# Patient Record
Sex: Female | Born: 1985 | Race: White | Hispanic: No | Marital: Single
Health system: Southern US, Community
[De-identification: ages and names within clinical notes are randomized; demographics above are authoritative.]

---
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
## 2011-04-30 ENCOUNTER — Emergency Department: Payer: Self-pay | Admitting: Emergency Medicine

## 2011-05-05 ENCOUNTER — Emergency Department: Payer: Self-pay | Admitting: Emergency Medicine

## 2011-05-07 ENCOUNTER — Inpatient Hospital Stay: Payer: Self-pay | Admitting: Psychiatry

## 2011-05-27 ENCOUNTER — Ambulatory Visit: Payer: Self-pay | Admitting: Internal Medicine

## 2011-06-04 ENCOUNTER — Emergency Department: Payer: Self-pay | Admitting: Emergency Medicine

## 2011-07-28 ENCOUNTER — Inpatient Hospital Stay: Payer: Self-pay | Admitting: Psychiatry

## 2011-08-01 LAB — COMPREHENSIVE METABOLIC PANEL
Anion Gap: 12 (ref 7–16)
BUN: 33 mg/dL — ABNORMAL HIGH (ref 7–18)
Bilirubin,Total: 0.7 mg/dL (ref 0.2–1.0)
Calcium, Total: 9.2 mg/dL (ref 8.5–10.1)
Chloride: 111 mmol/L — ABNORMAL HIGH (ref 98–107)
Co2: 21 mmol/L (ref 21–32)
EGFR (African American): 51 — ABNORMAL LOW
EGFR (Non-African Amer.): 42 — ABNORMAL LOW
Glucose: 105 mg/dL — ABNORMAL HIGH (ref 65–99)
Osmolality: 294 (ref 275–301)
Potassium: 3.8 mmol/L (ref 3.5–5.1)
SGOT(AST): 49 U/L — ABNORMAL HIGH (ref 15–37)
SGPT (ALT): 35 U/L
Sodium: 144 mmol/L (ref 136–145)
Total Protein: 9.1 g/dL — ABNORMAL HIGH (ref 6.4–8.2)

## 2011-08-01 LAB — VALPROIC ACID LEVEL: Valproic Acid: 73 ug/mL

## 2011-08-09 ENCOUNTER — Emergency Department: Payer: Self-pay | Admitting: Internal Medicine

## 2011-08-14 ENCOUNTER — Inpatient Hospital Stay: Payer: Self-pay | Admitting: Psychiatry

## 2011-08-14 LAB — URINALYSIS, COMPLETE
Bilirubin,UR: NEGATIVE
Ph: 6 (ref 4.5–8.0)
Protein: 100
RBC,UR: 7 /HPF (ref 0–5)
Squamous Epithelial: 45
WBC UR: 26 /HPF (ref 0–5)

## 2011-08-14 LAB — COMPREHENSIVE METABOLIC PANEL
Albumin: 2.6 g/dL — ABNORMAL LOW (ref 3.4–5.0)
Alkaline Phosphatase: 177 U/L — ABNORMAL HIGH (ref 50–136)
Anion Gap: 15 (ref 7–16)
Calcium, Total: 7.8 mg/dL — ABNORMAL LOW (ref 8.5–10.1)
Chloride: 113 mmol/L — ABNORMAL HIGH (ref 98–107)
Co2: 17 mmol/L — ABNORMAL LOW (ref 21–32)
Creatinine: 1.5 mg/dL — ABNORMAL HIGH (ref 0.60–1.30)
EGFR (African American): 54 — ABNORMAL LOW
EGFR (Non-African Amer.): 45 — ABNORMAL LOW
Osmolality: 286 (ref 275–301)
Potassium: 3.2 mmol/L — ABNORMAL LOW (ref 3.5–5.1)
SGPT (ALT): 26 U/L
Sodium: 145 mmol/L (ref 136–145)
Total Protein: 6.2 g/dL — ABNORMAL LOW (ref 6.4–8.2)

## 2011-08-14 LAB — PREGNANCY, URINE: Pregnancy Test, Urine: NEGATIVE m[IU]/mL

## 2011-08-14 LAB — CBC
MCH: 39.2 pg — ABNORMAL HIGH (ref 26.0–34.0)
MCV: 115 fL — ABNORMAL HIGH (ref 80–100)
Platelet: 126 10*3/uL — ABNORMAL LOW (ref 150–440)
RDW: 15 % — ABNORMAL HIGH (ref 11.5–14.5)
WBC: 6 10*3/uL (ref 3.6–11.0)

## 2011-08-14 LAB — DRUG SCREEN, URINE
Barbiturates, Ur Screen: NEGATIVE (ref ?–200)
Benzodiazepine, Ur Scrn: NEGATIVE (ref ?–200)
Cannabinoid 50 Ng, Ur ~~LOC~~: NEGATIVE (ref ?–50)
Cocaine Metabolite,Ur ~~LOC~~: NEGATIVE (ref ?–300)
MDMA (Ecstasy)Ur Screen: POSITIVE (ref ?–500)
Methadone, Ur Screen: NEGATIVE (ref ?–300)
Opiate, Ur Screen: POSITIVE (ref ?–300)
Tricyclic, Ur Screen: NEGATIVE (ref ?–1000)

## 2011-08-14 LAB — AMMONIA: Ammonia, Plasma: 38 mcmol/L — ABNORMAL HIGH (ref 11–32)

## 2011-08-14 LAB — TSH: Thyroid Stimulating Horm: 1.62 u[IU]/mL

## 2011-08-15 LAB — URINALYSIS, COMPLETE
Blood: NEGATIVE
Glucose,UR: >=500 mg/dL (ref 0–75)
Nitrite: NEGATIVE
Protein: 30
RBC,UR: 1 /HPF (ref 0–5)
WBC UR: 3 /HPF (ref 0–5)

## 2011-08-15 LAB — BASIC METABOLIC PANEL
Anion Gap: 9 (ref 7–16)
BUN: 6 mg/dL — ABNORMAL LOW (ref 7–18)
Calcium, Total: 8.5 mg/dL (ref 8.5–10.1)
Chloride: 114 mmol/L — ABNORMAL HIGH (ref 98–107)
EGFR (African American): 52 — ABNORMAL LOW
EGFR (Non-African Amer.): 43 — ABNORMAL LOW
Glucose: 83 mg/dL (ref 65–99)

## 2011-08-15 LAB — LIPID PANEL: VLDL Cholesterol, Calc: 28 mg/dL (ref 5–40)

## 2011-08-16 LAB — COMPREHENSIVE METABOLIC PANEL
Albumin: 2.8 g/dL — ABNORMAL LOW (ref 3.4–5.0)
Anion Gap: 9 (ref 7–16)
BUN: 9 mg/dL (ref 7–18)
Bilirubin,Total: 0.5 mg/dL (ref 0.2–1.0)
Chloride: 115 mmol/L — ABNORMAL HIGH (ref 98–107)
Co2: 19 mmol/L — ABNORMAL LOW (ref 21–32)
Creatinine: 1.55 mg/dL — ABNORMAL HIGH (ref 0.60–1.30)
EGFR (African American): 52 — ABNORMAL LOW
Potassium: 3.8 mmol/L (ref 3.5–5.1)
SGOT(AST): 48 U/L — ABNORMAL HIGH (ref 15–37)
Total Protein: 6.8 g/dL (ref 6.4–8.2)

## 2011-08-16 LAB — FOLATE: Folic Acid: 17.5 ng/mL (ref 3.1–100.0)

## 2011-08-17 LAB — BASIC METABOLIC PANEL
Anion Gap: 8 (ref 7–16)
BUN: 10 mg/dL (ref 7–18)
Calcium, Total: 8.5 mg/dL (ref 8.5–10.1)
Chloride: 114 mmol/L — ABNORMAL HIGH (ref 98–107)
Co2: 21 mmol/L (ref 21–32)
Creatinine: 1.68 mg/dL — ABNORMAL HIGH (ref 0.60–1.30)
EGFR (Non-African Amer.): 39 — ABNORMAL LOW
Osmolality: 283 (ref 275–301)
Potassium: 3.7 mmol/L (ref 3.5–5.1)

## 2011-08-18 LAB — BASIC METABOLIC PANEL
Calcium, Total: 8.1 mg/dL — ABNORMAL LOW (ref 8.5–10.1)
Co2: 18 mmol/L — ABNORMAL LOW (ref 21–32)
Creatinine: 1.54 mg/dL — ABNORMAL HIGH (ref 0.60–1.30)
EGFR (Non-African Amer.): 43 — ABNORMAL LOW
Sodium: 146 mmol/L — ABNORMAL HIGH (ref 136–145)

## 2011-08-19 LAB — BASIC METABOLIC PANEL
Calcium, Total: 8.5 mg/dL (ref 8.5–10.1)
Chloride: 114 mmol/L — ABNORMAL HIGH (ref 98–107)
Co2: 20 mmol/L — ABNORMAL LOW (ref 21–32)
EGFR (Non-African Amer.): 42 — ABNORMAL LOW
Potassium: 3.7 mmol/L (ref 3.5–5.1)
Sodium: 143 mmol/L (ref 136–145)

## 2011-08-20 LAB — PROTEIN ELECTROPHORESIS(ARMC)

## 2011-08-21 LAB — BASIC METABOLIC PANEL
BUN: 12 mg/dL (ref 7–18)
Co2: 21 mmol/L (ref 21–32)
Creatinine: 1.39 mg/dL — ABNORMAL HIGH (ref 0.60–1.30)
EGFR (Non-African Amer.): 49 — ABNORMAL LOW
Glucose: 79 mg/dL (ref 65–99)
Potassium: 4.1 mmol/L (ref 3.5–5.1)
Sodium: 144 mmol/L (ref 136–145)

## 2011-08-29 ENCOUNTER — Emergency Department: Payer: Self-pay | Admitting: Emergency Medicine

## 2011-08-30 LAB — URINALYSIS, COMPLETE
Glucose,UR: >=500 mg/dL (ref 0–75)
Nitrite: NEGATIVE
Ph: 7 (ref 4.5–8.0)
Protein: 30
RBC,UR: 90 /HPF (ref 0–5)
Specific Gravity: 1.002 (ref 1.003–1.030)

## 2011-08-30 LAB — COMPREHENSIVE METABOLIC PANEL
Anion Gap: 9 (ref 7–16)
BUN: 9 mg/dL (ref 7–18)
Chloride: 111 mmol/L — ABNORMAL HIGH (ref 98–107)
Creatinine: 1.22 mg/dL (ref 0.60–1.30)
EGFR (African American): >60
Osmolality: 276 (ref 275–301)
SGPT (ALT): 18 U/L
Sodium: 138 mmol/L (ref 136–145)
Total Protein: 7.3 g/dL (ref 6.4–8.2)

## 2011-08-30 LAB — DRUG SCREEN, URINE
Barbiturates, Ur Screen: NEGATIVE (ref ?–200)
Benzodiazepine, Ur Scrn: NEGATIVE (ref ?–200)
Cocaine Metabolite,Ur ~~LOC~~: NEGATIVE (ref ?–300)
Methadone, Ur Screen: NEGATIVE (ref ?–300)
Phencyclidine (PCP) Ur S: NEGATIVE (ref ?–25)
Tricyclic, Ur Screen: NEGATIVE (ref ?–1000)

## 2011-08-30 LAB — CBC
HCT: 39.6 % (ref 35.0–47.0)
HGB: 13.4 g/dL (ref 12.0–16.0)
MCV: 115 fL — ABNORMAL HIGH (ref 80–100)
WBC: 5.7 10*3/uL (ref 3.6–11.0)

## 2011-08-30 LAB — TSH: Thyroid Stimulating Horm: 1.16 u[IU]/mL

## 2011-08-30 LAB — PREGNANCY, URINE: Pregnancy Test, Urine: NEGATIVE m[IU]/mL

## 2011-08-30 LAB — SALICYLATE LEVEL: Salicylates, Serum: <1.7 mg/dL

## 2011-09-01 ENCOUNTER — Emergency Department: Payer: Self-pay | Admitting: Emergency Medicine

## 2011-09-07 ENCOUNTER — Emergency Department: Payer: Self-pay | Admitting: Emergency Medicine

## 2011-09-07 LAB — URINALYSIS, COMPLETE
Bilirubin,UR: NEGATIVE
Leukocyte Esterase: NEGATIVE
Ph: 7 (ref 4.5–8.0)
Protein: 100
RBC,UR: NONE SEEN /HPF (ref 0–5)
Specific Gravity: 1.024 (ref 1.003–1.030)
WBC UR: 7 /HPF (ref 0–5)

## 2011-09-07 LAB — DRUG SCREEN, URINE
Amphetamines, Ur Screen: NEGATIVE (ref ?–1000)
Cannabinoid 50 Ng, Ur ~~LOC~~: NEGATIVE (ref ?–50)
Cocaine Metabolite,Ur ~~LOC~~: NEGATIVE (ref ?–300)
MDMA (Ecstasy)Ur Screen: POSITIVE (ref ?–500)
Opiate, Ur Screen: NEGATIVE (ref ?–300)
Tricyclic, Ur Screen: NEGATIVE (ref ?–1000)

## 2011-09-13 ENCOUNTER — Emergency Department: Payer: Self-pay | Admitting: Emergency Medicine

## 2011-09-13 LAB — BASIC METABOLIC PANEL
Chloride: 107 mmol/L (ref 98–107)
Co2: 22 mmol/L (ref 21–32)
Creatinine: 1.34 mg/dL — ABNORMAL HIGH (ref 0.60–1.30)
EGFR (African American): >60
Potassium: 3.4 mmol/L — ABNORMAL LOW (ref 3.5–5.1)
Sodium: 139 mmol/L (ref 136–145)

## 2011-09-13 LAB — URINALYSIS, COMPLETE
Bilirubin,UR: NEGATIVE
Blood: NEGATIVE
Glucose,UR: >=500 mg/dL (ref 0–75)
Leukocyte Esterase: NEGATIVE
RBC,UR: 1 /HPF (ref 0–5)
Specific Gravity: 1.01 (ref 1.003–1.030)
Squamous Epithelial: 24

## 2011-09-13 LAB — CBC
HGB: 14.3 g/dL (ref 12.0–16.0)
RBC: 3.66 10*6/uL — ABNORMAL LOW (ref 3.80–5.20)
WBC: 5.6 10*3/uL (ref 3.6–11.0)

## 2011-09-13 LAB — ETHANOL: Ethanol %: <0.003 % (ref 0.000–0.080)

## 2011-09-13 LAB — DRUG SCREEN, URINE
Barbiturates, Ur Screen: NEGATIVE (ref ?–200)
Cannabinoid 50 Ng, Ur ~~LOC~~: NEGATIVE (ref ?–50)
Cocaine Metabolite,Ur ~~LOC~~: NEGATIVE (ref ?–300)
MDMA (Ecstasy)Ur Screen: POSITIVE (ref ?–500)
Methadone, Ur Screen: NEGATIVE (ref ?–300)
Opiate, Ur Screen: NEGATIVE (ref ?–300)
Phencyclidine (PCP) Ur S: NEGATIVE (ref ?–25)
Tricyclic, Ur Screen: NEGATIVE (ref ?–1000)

## 2011-09-13 LAB — TSH: Thyroid Stimulating Horm: 0.565 u[IU]/mL

## 2011-09-13 LAB — CK: CK, Total: 53 U/L (ref 21–215)

## 2011-09-15 ENCOUNTER — Emergency Department: Payer: Self-pay | Admitting: Emergency Medicine

## 2011-09-19 ENCOUNTER — Emergency Department: Payer: Self-pay | Admitting: Emergency Medicine

## 2011-09-26 ENCOUNTER — Emergency Department: Payer: Self-pay | Admitting: Emergency Medicine

## 2011-09-26 LAB — ACETAMINOPHEN LEVEL: Acetaminophen: <2 ug/mL

## 2011-09-26 LAB — URINALYSIS, COMPLETE
Bilirubin,UR: NEGATIVE
Glucose,UR: >=500 mg/dL (ref 0–75)
Ketone: NEGATIVE
Ph: 6 (ref 4.5–8.0)
RBC,UR: 10 /HPF (ref 0–5)
Squamous Epithelial: 33

## 2011-09-26 LAB — DRUG SCREEN, URINE
Barbiturates, Ur Screen: NEGATIVE (ref ?–200)
Benzodiazepine, Ur Scrn: NEGATIVE (ref ?–200)
Cannabinoid 50 Ng, Ur ~~LOC~~: NEGATIVE (ref ?–50)
Cocaine Metabolite,Ur ~~LOC~~: NEGATIVE (ref ?–300)
MDMA (Ecstasy)Ur Screen: POSITIVE (ref ?–500)
Methadone, Ur Screen: NEGATIVE (ref ?–300)
Phencyclidine (PCP) Ur S: NEGATIVE (ref ?–25)

## 2011-09-26 LAB — CBC
HCT: 39.3 % (ref 35.0–47.0)
HGB: 13.3 g/dL (ref 12.0–16.0)
MCH: 38.5 pg — ABNORMAL HIGH (ref 26.0–34.0)
MCHC: 33.8 g/dL (ref 32.0–36.0)
MCV: 114 fL — ABNORMAL HIGH (ref 80–100)
Platelet: 66 10*3/uL — ABNORMAL LOW (ref 150–440)
RBC: 3.45 10*6/uL — ABNORMAL LOW (ref 3.80–5.20)
RDW: 13.1 % (ref 11.5–14.5)
WBC: 6.2 10*3/uL (ref 3.6–11.0)

## 2011-09-26 LAB — COMPREHENSIVE METABOLIC PANEL
Albumin: 2.8 g/dL — ABNORMAL LOW (ref 3.4–5.0)
Alkaline Phosphatase: 170 U/L — ABNORMAL HIGH (ref 50–136)
Anion Gap: 13 (ref 7–16)
BUN: 18 mg/dL (ref 7–18)
Bilirubin,Total: 0.2 mg/dL (ref 0.2–1.0)
Calcium, Total: 8.3 mg/dL — ABNORMAL LOW (ref 8.5–10.1)
Chloride: 111 mmol/L — ABNORMAL HIGH (ref 98–107)
Co2: 18 mmol/L — ABNORMAL LOW (ref 21–32)
Creatinine: 1.25 mg/dL (ref 0.60–1.30)
EGFR (African American): >60
EGFR (Non-African Amer.): 55 — ABNORMAL LOW
Glucose: 96 mg/dL (ref 65–99)
Osmolality: 285 (ref 275–301)
Potassium: 4 mmol/L (ref 3.5–5.1)
SGOT(AST): 18 U/L (ref 15–37)
SGPT (ALT): 11 U/L — ABNORMAL LOW
Sodium: 142 mmol/L (ref 136–145)
Total Protein: 7.3 g/dL (ref 6.4–8.2)

## 2011-09-26 LAB — TSH: Thyroid Stimulating Horm: 1.85 u[IU]/mL

## 2011-09-26 LAB — SALICYLATE LEVEL: Salicylates, Serum: <1.7 mg/dL

## 2011-09-27 LAB — CBC
HCT: 40.9 % (ref 35.0–47.0)
HGB: 14 g/dL (ref 12.0–16.0)
MCHC: 34.2 g/dL (ref 32.0–36.0)
Platelet: 157 10*3/uL (ref 150–440)
WBC: 6.8 10*3/uL (ref 3.6–11.0)

## 2011-09-27 LAB — BASIC METABOLIC PANEL
BUN: 19 mg/dL — ABNORMAL HIGH (ref 7–18)
Chloride: 110 mmol/L — ABNORMAL HIGH (ref 98–107)
Creatinine: 1.4 mg/dL — ABNORMAL HIGH (ref 0.60–1.30)
EGFR (Non-African Amer.): 48 — ABNORMAL LOW
Glucose: 82 mg/dL (ref 65–99)
Osmolality: 284 (ref 275–301)
Potassium: 4 mmol/L (ref 3.5–5.1)

## 2011-10-23 ENCOUNTER — Ambulatory Visit: Payer: Self-pay | Admitting: Specialist

## 2011-10-28 ENCOUNTER — Emergency Department: Payer: Self-pay | Admitting: Emergency Medicine

## 2011-10-28 LAB — COMPREHENSIVE METABOLIC PANEL
Alkaline Phosphatase: 233 U/L — ABNORMAL HIGH (ref 50–136)
Anion Gap: 6 — ABNORMAL LOW (ref 7–16)
BUN: 15 mg/dL (ref 7–18)
Bilirubin,Total: 0.4 mg/dL (ref 0.2–1.0)
Calcium, Total: 8.3 mg/dL — ABNORMAL LOW (ref 8.5–10.1)
Chloride: 108 mmol/L — ABNORMAL HIGH (ref 98–107)
Co2: 23 mmol/L (ref 21–32)
Creatinine: 1.48 mg/dL — ABNORMAL HIGH (ref 0.60–1.30)
Glucose: 86 mg/dL (ref 65–99)
Potassium: 3.9 mmol/L (ref 3.5–5.1)
SGOT(AST): 15 U/L (ref 15–37)
SGPT (ALT): 15 U/L
Sodium: 137 mmol/L (ref 136–145)
Total Protein: 7.8 g/dL (ref 6.4–8.2)

## 2011-10-28 LAB — CBC
MCH: 38.9 pg — ABNORMAL HIGH (ref 26.0–34.0)
Platelet: 108 10*3/uL — ABNORMAL LOW (ref 150–440)
RBC: 3.74 10*6/uL — ABNORMAL LOW (ref 3.80–5.20)
WBC: 6.2 10*3/uL (ref 3.6–11.0)

## 2011-10-28 LAB — URINALYSIS, COMPLETE
Nitrite: NEGATIVE
Ph: 6 (ref 4.5–8.0)
Protein: 100
RBC,UR: 2 /HPF (ref 0–5)
Squamous Epithelial: 13

## 2011-10-28 LAB — PREGNANCY, URINE: Pregnancy Test, Urine: NEGATIVE m[IU]/mL

## 2011-10-30 ENCOUNTER — Emergency Department: Payer: Self-pay | Admitting: Emergency Medicine

## 2011-10-31 LAB — COMPREHENSIVE METABOLIC PANEL
Albumin: 2.9 g/dL — ABNORMAL LOW (ref 3.4–5.0)
Alkaline Phosphatase: 212 U/L — ABNORMAL HIGH (ref 50–136)
Anion Gap: 10 (ref 7–16)
BUN: 17 mg/dL (ref 7–18)
Calcium, Total: 8.2 mg/dL — ABNORMAL LOW (ref 8.5–10.1)
Chloride: 112 mmol/L — ABNORMAL HIGH (ref 98–107)
Co2: 19 mmol/L — ABNORMAL LOW (ref 21–32)
EGFR (African American): 60 — ABNORMAL LOW
EGFR (Non-African Amer.): 49 — ABNORMAL LOW
Potassium: 4 mmol/L (ref 3.5–5.1)
SGOT(AST): 25 U/L (ref 15–37)
SGPT (ALT): 15 U/L
Total Protein: 6.8 g/dL (ref 6.4–8.2)

## 2011-10-31 LAB — URINALYSIS, COMPLETE
Bilirubin,UR: NEGATIVE
Glucose,UR: 150 mg/dL (ref 0–75)
Hyaline Cast: 2
Ketone: NEGATIVE
Ph: 6 (ref 4.5–8.0)
RBC,UR: 18 /HPF (ref 0–5)
Specific Gravity: 1.021 (ref 1.003–1.030)
Squamous Epithelial: 29
Transitional Epi: 1
WBC UR: 106 /HPF (ref 0–5)

## 2011-10-31 LAB — CBC WITH DIFFERENTIAL/PLATELET
Basophil #: 0.1 10*3/uL (ref 0.0–0.1)
Basophil %: 2.6 %
Eosinophil #: 0.1 10*3/uL (ref 0.0–0.7)
HCT: 40.1 % (ref 35.0–47.0)
Lymphocyte %: 56.2 %
MCHC: 33.1 g/dL (ref 32.0–36.0)
Monocyte %: 8.9 %
Neutrophil #: 1.6 10*3/uL (ref 1.4–6.5)
RBC: 3.51 10*6/uL — ABNORMAL LOW (ref 3.80–5.20)

## 2011-10-31 LAB — PREGNANCY, URINE: Pregnancy Test, Urine: NEGATIVE m[IU]/mL

## 2011-11-01 ENCOUNTER — Emergency Department: Payer: Self-pay | Admitting: Emergency Medicine

## 2011-11-01 LAB — COMPREHENSIVE METABOLIC PANEL
Anion Gap: 10 (ref 7–16)
BUN: 19 mg/dL — ABNORMAL HIGH (ref 7–18)
Bilirubin,Total: 0.3 mg/dL (ref 0.2–1.0)
Calcium, Total: 8.4 mg/dL — ABNORMAL LOW (ref 8.5–10.1)
Chloride: 110 mmol/L — ABNORMAL HIGH (ref 98–107)
Co2: 19 mmol/L — ABNORMAL LOW (ref 21–32)
Creatinine: 1.55 mg/dL — ABNORMAL HIGH (ref 0.60–1.30)
EGFR (African American): 52 — ABNORMAL LOW
Glucose: 80 mg/dL (ref 65–99)
Potassium: 4.1 mmol/L (ref 3.5–5.1)
SGOT(AST): 31 U/L (ref 15–37)
SGPT (ALT): 19 U/L
Sodium: 139 mmol/L (ref 136–145)

## 2011-11-01 LAB — SALICYLATE LEVEL: Salicylates, Serum: <1.7 mg/dL

## 2011-11-01 LAB — CBC
HCT: 42.4 % (ref 35.0–47.0)
Platelet: 129 10*3/uL — ABNORMAL LOW (ref 150–440)
RBC: 3.7 10*6/uL — ABNORMAL LOW (ref 3.80–5.20)
WBC: 6.8 10*3/uL (ref 3.6–11.0)

## 2011-11-01 LAB — TSH: Thyroid Stimulating Horm: 2.43 u[IU]/mL

## 2011-11-01 LAB — ETHANOL: Ethanol %: <0.003 % (ref 0.000–0.080)

## 2011-11-02 LAB — URINALYSIS, COMPLETE
Glucose,UR: 150 mg/dL (ref 0–75)
Ketone: NEGATIVE
Nitrite: NEGATIVE
Ph: 7 (ref 4.5–8.0)
Protein: 30
RBC,UR: 104 /HPF (ref 0–5)
Specific Gravity: 1.012 (ref 1.003–1.030)
Squamous Epithelial: 5
WBC UR: 7 /HPF (ref 0–5)

## 2011-11-02 LAB — DRUG SCREEN, URINE
Amphetamines, Ur Screen: NEGATIVE (ref ?–1000)
Benzodiazepine, Ur Scrn: NEGATIVE (ref ?–200)
Cannabinoid 50 Ng, Ur ~~LOC~~: NEGATIVE (ref ?–50)
Cocaine Metabolite,Ur ~~LOC~~: NEGATIVE (ref ?–300)
Methadone, Ur Screen: NEGATIVE (ref ?–300)
Phencyclidine (PCP) Ur S: NEGATIVE (ref ?–25)

## 2011-11-02 LAB — PREGNANCY, URINE: Pregnancy Test, Urine: NEGATIVE m[IU]/mL

## 2011-11-03 LAB — URINE CULTURE

## 2013-12-02 IMAGING — CT CT ABD-PELV W/O CM
1 of 2 series · 16 of 32 positions shown, 20 images · non-contrast
Comparison: Abdominal Ultrasound of 05/01/2011.

REASON FOR EXAM: (1) abd pain; (2) abd pain;    NOTE: Nursing to Give
Oral CT Contrast
COMMENTS:

PROCEDURE:     CT  - CT ABDOMEN AND PELVIS W[DATE] [DATE]
RESULT:
HISTORY: Pain.

[Series 2: soft tissue · axial · 0.71mm/px · z∈[-442,-64]mm · 16 of 138 slices shown, 20 images]
[im 6/138  soft-tissue]
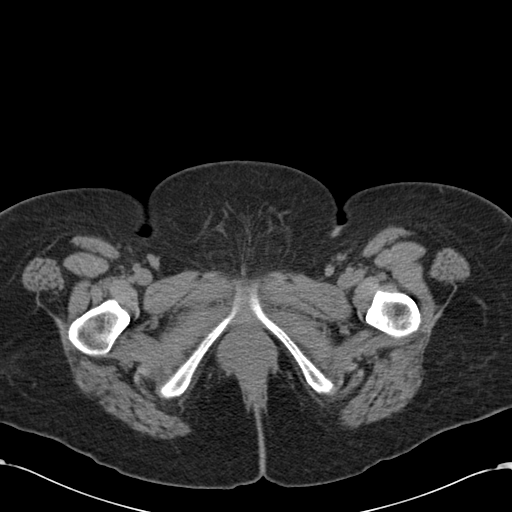
[im 6/138  bone]
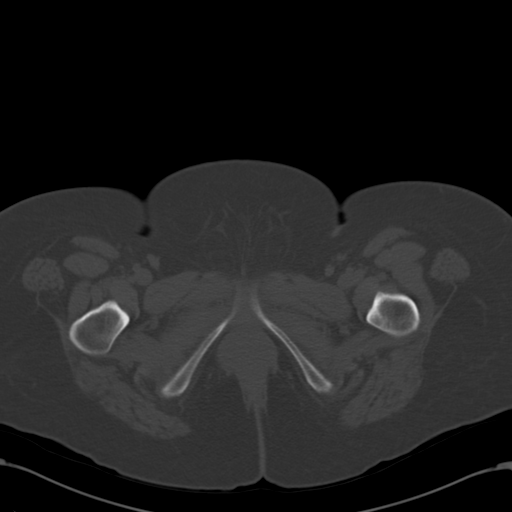
[im 16/138  soft-tissue]
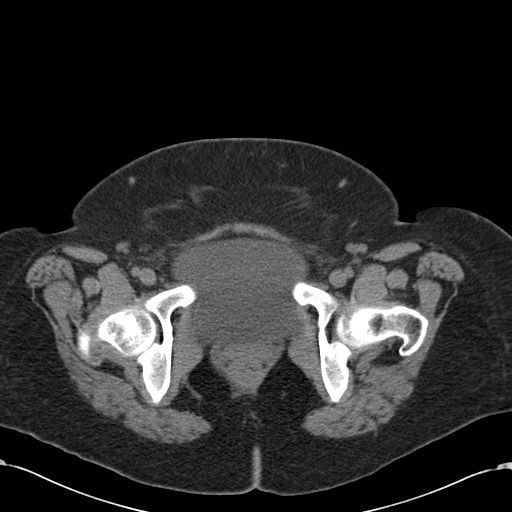
[im 27/138  soft-tissue]
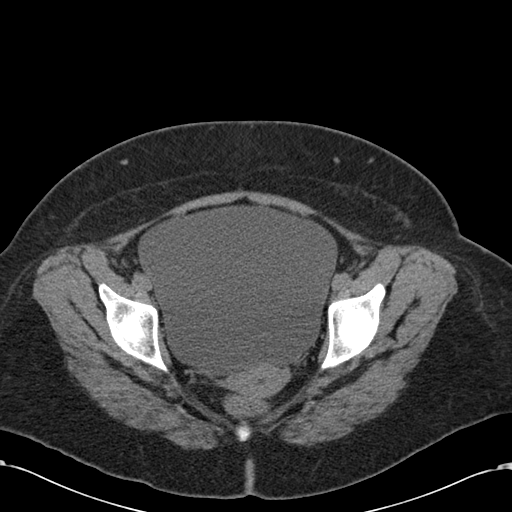
[im 37/138  soft-tissue]
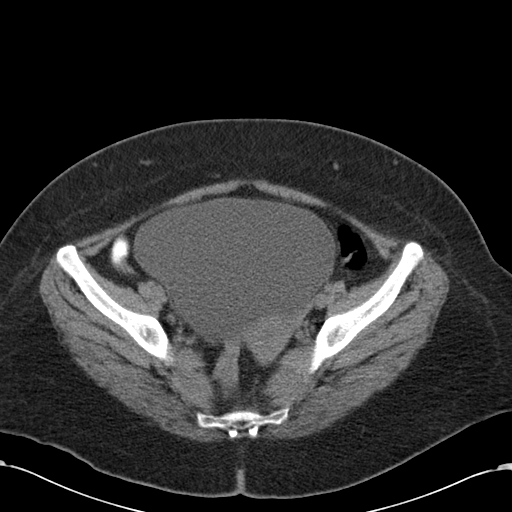
[im 48/138  soft-tissue]
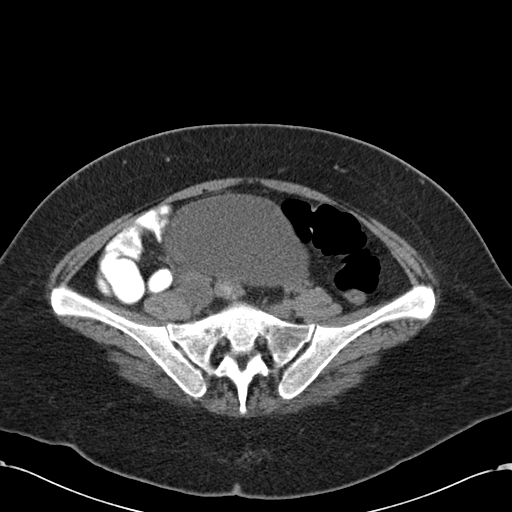
[im 53/138  soft-tissue]
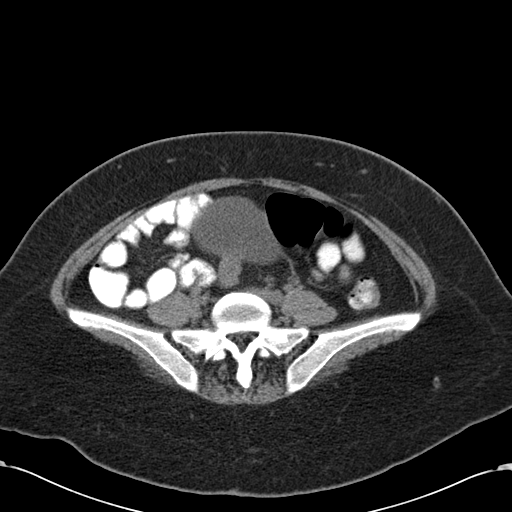
[im 64/138  soft-tissue]
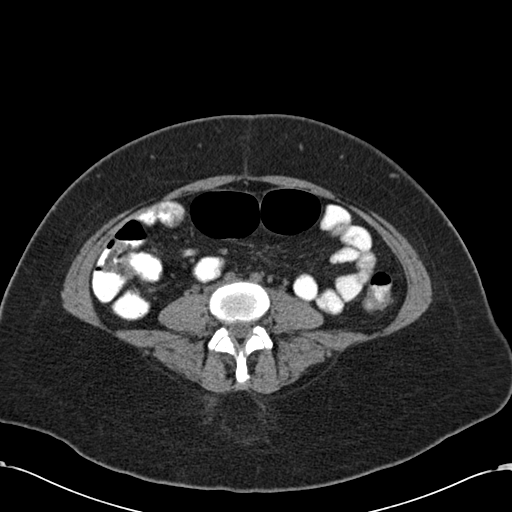
[im 74/138  soft-tissue]
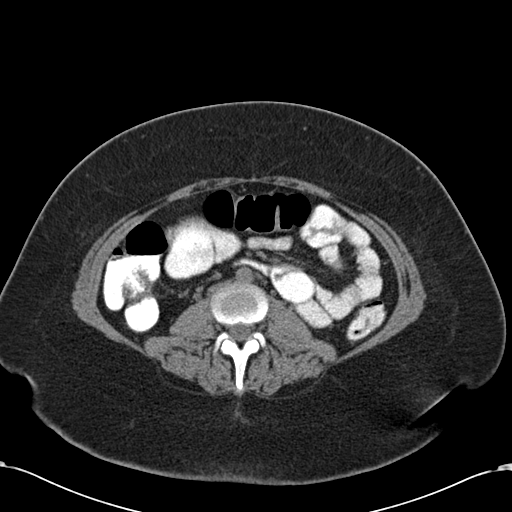
[im 85/138  soft-tissue]
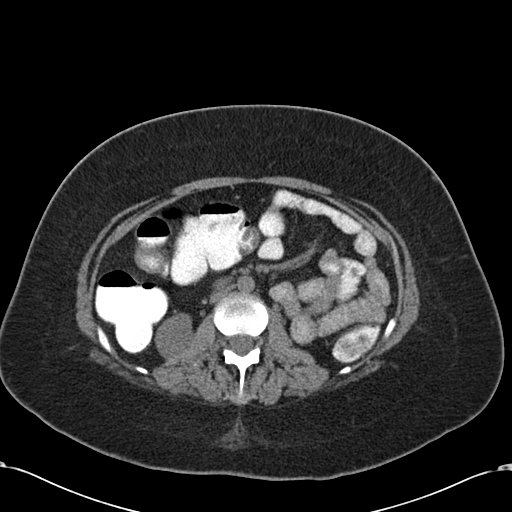
[im 85/138  bone]
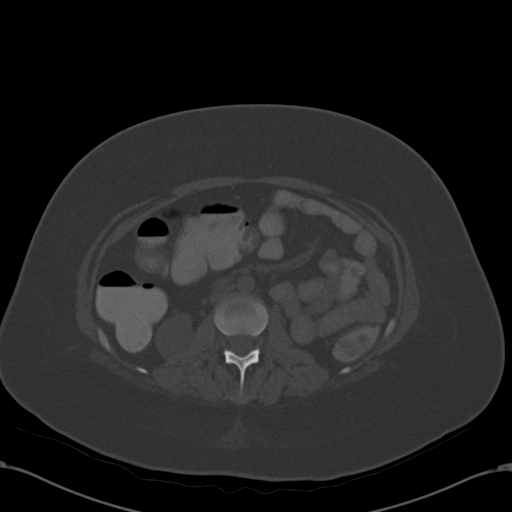
[im 90/138  soft-tissue]
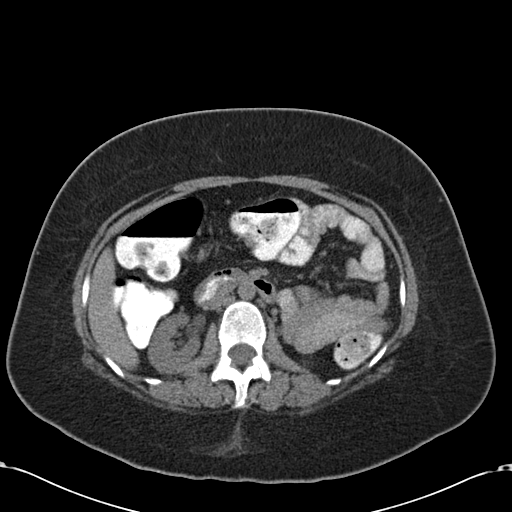
[im 101/138  soft-tissue]
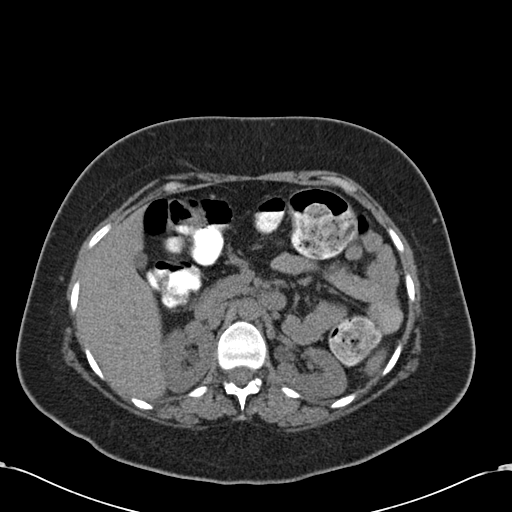
[im 111/138  soft-tissue]
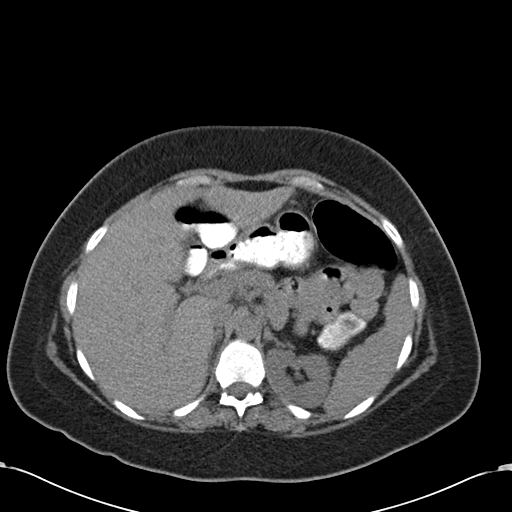
[im 116/138  lung]
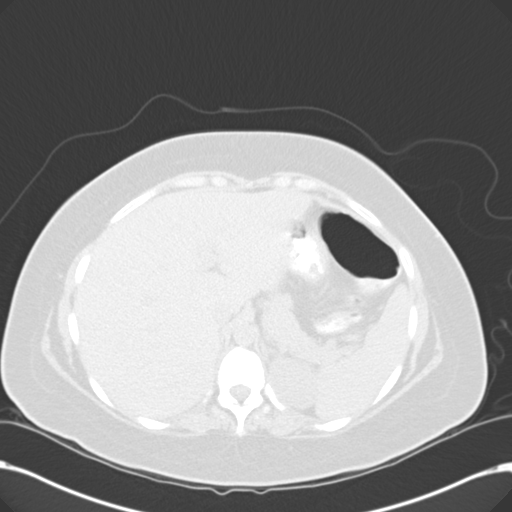
[im 122/138  soft-tissue]
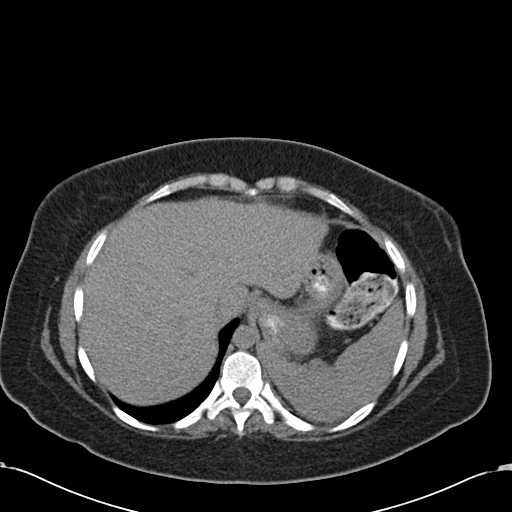
[im 122/138  lung]
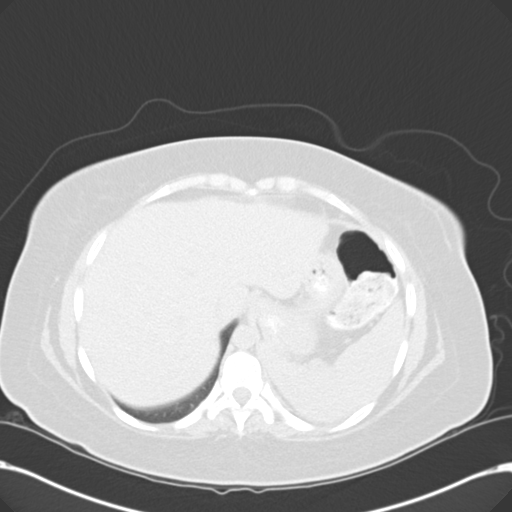
[im 127/138  lung]
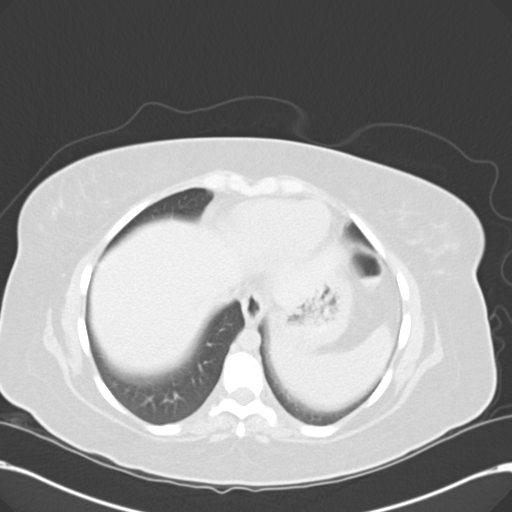
[im 132/138  soft-tissue]
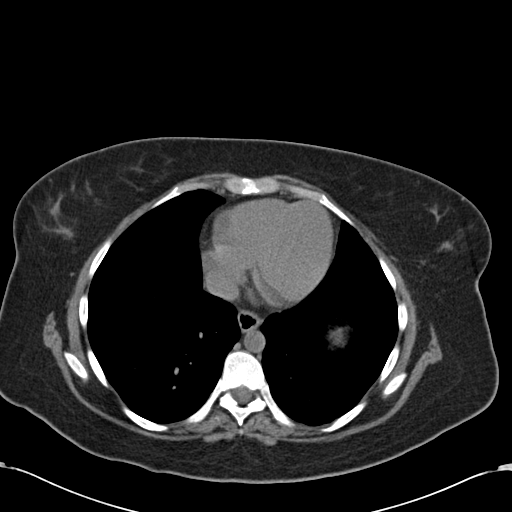
[im 132/138  lung]
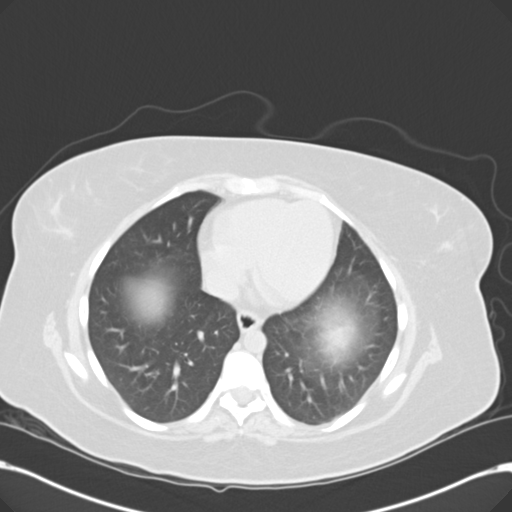

[16 of 32 positions shown; findings below may reference images not displayed]

PROCEDURE AND FINDINGS:  Standard nonenhanced scan obtained. The liver,
spleen, pancreas and adrenals are normal. There is no hydronephrosis. Mild
prominence of the bladder noted. No bladder wall thickening. Aorta is
nondistended. No bowel distention. Appendix is normal. The lung bases are
clear. No free air. Tiny, umbilical hernia with herniation of fat only noted.
IMPRESSION: Minimal bladder prominence. Otherwise unremarkable exam.

## 2013-12-06 IMAGING — RF DG UGI W/ KUB
9 series · 14 of 14 positions shown · non-contrast
Comparison: none

REASON FOR EXAM: presistent nausea
COMMENTS:

[Series 1: fluoro_barium 2fps_bw · 0.17mm/px · 1 of 1 slices shown (1 of 9)]
[im 1/1]
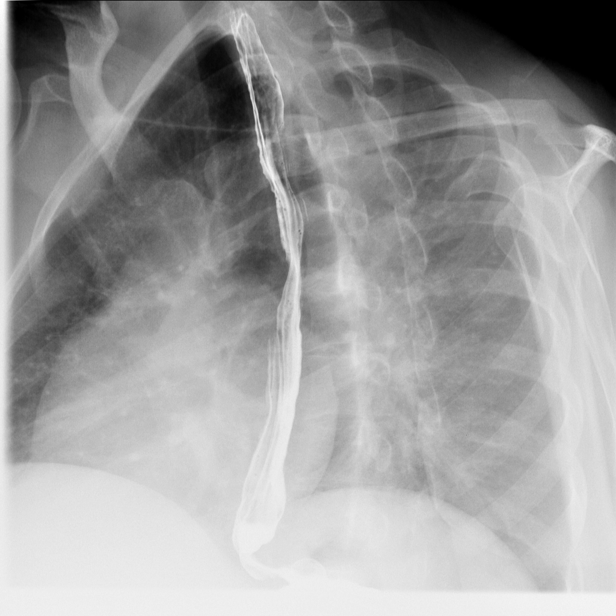

[Series 2: fluoro_barium 2fps_bw · 0.17mm/px · 1 of 1 slices shown (2 of 9)]
[im 1/1]
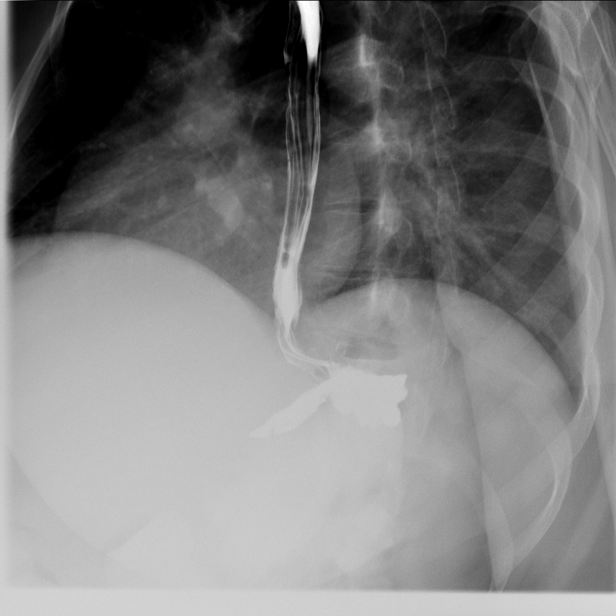

[Series 3: fluoro_barium 2fps_bw · 0.18mm/px · 1 of 1 slices shown (3 of 9)]
[im 1/1]
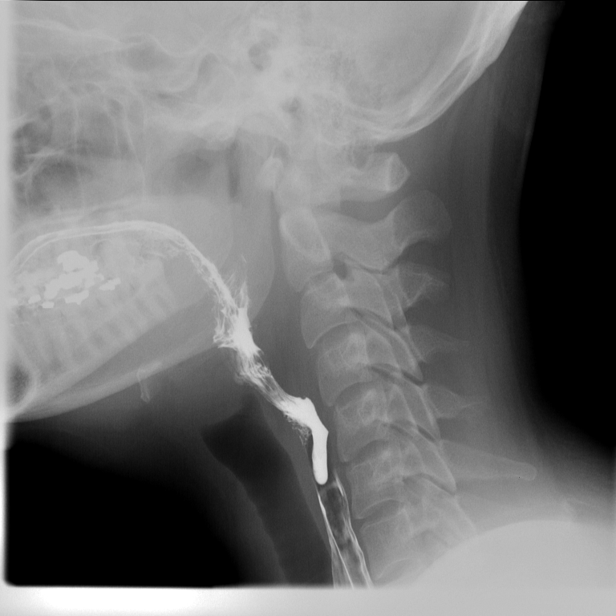

[Series 4: fluoro_barium 2fps_bw · 0.18mm/px · 1 of 1 slices shown (4 of 9)]
[im 1/1]
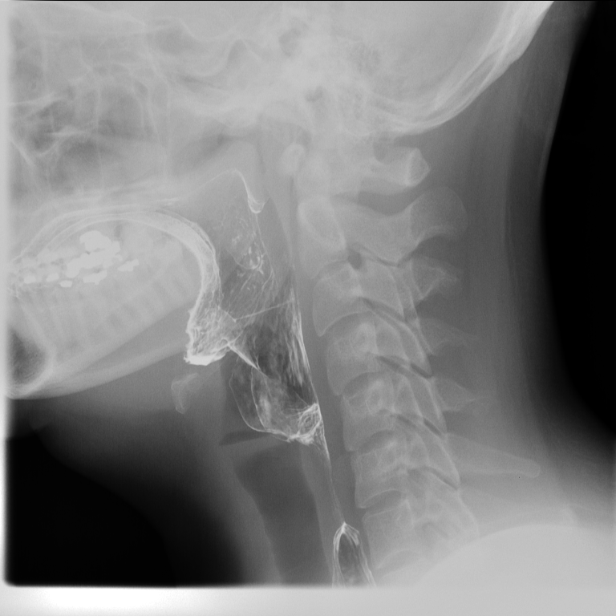

[Series 5: fluoro_barium 2fps_bw · 0.09mm/px · 2 of 2 frames shown (5 of 9)]
[frame 1/2]
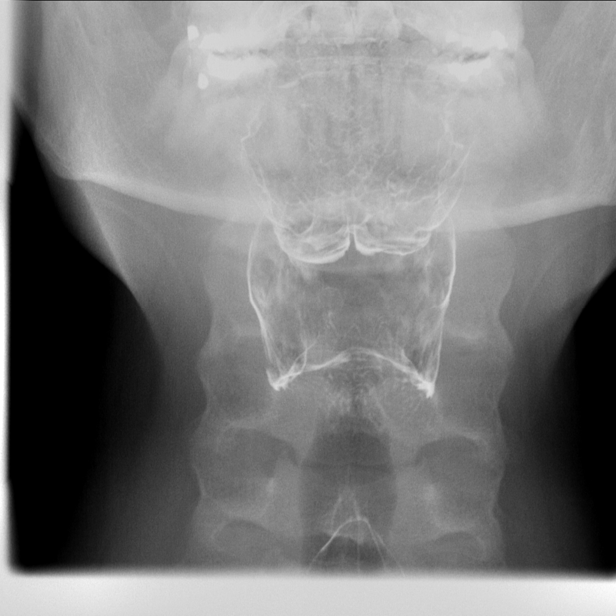
[frame 2/2]
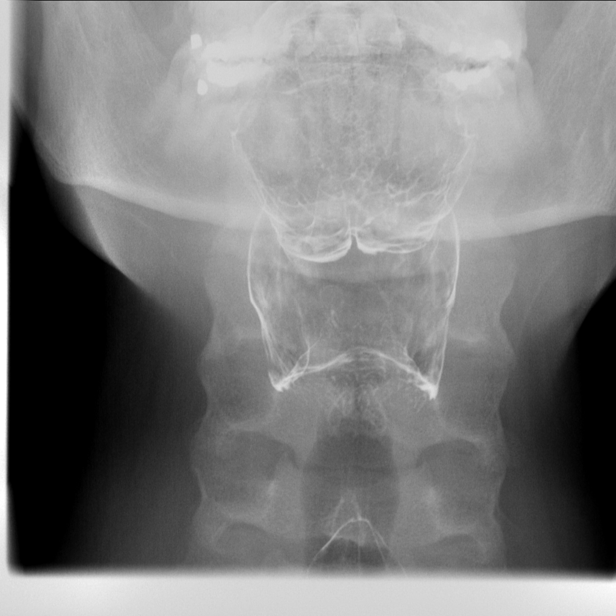

[Series 6: fluoro_barium 2fps_bw · 0.18mm/px · 1 of 1 slices shown (6 of 9)]
[im 1/1]
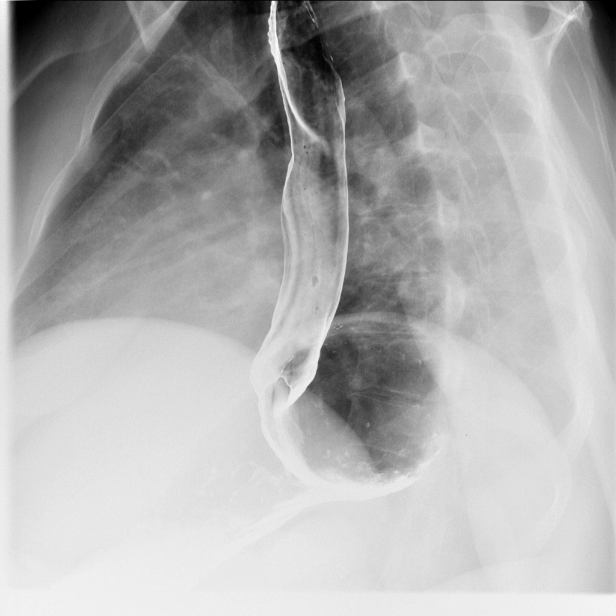

[Series 7: fluoro_barium 2fps_bw · 0.18mm/px · 2 of 2 frames shown (7 of 9)]
[frame 1/2]
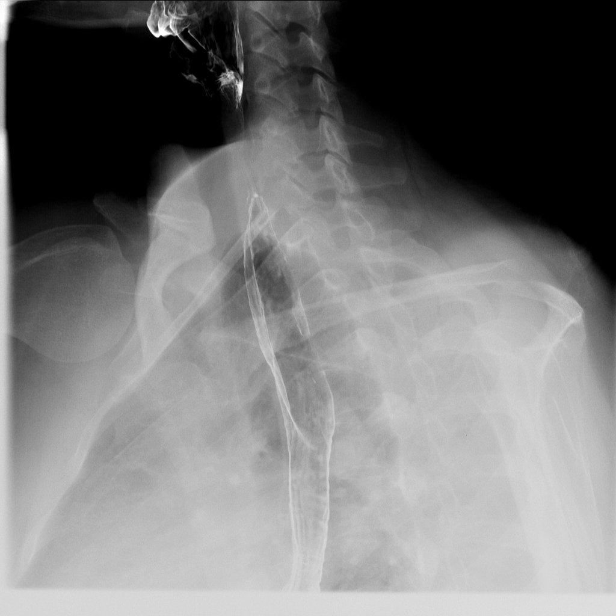
[frame 2/2]
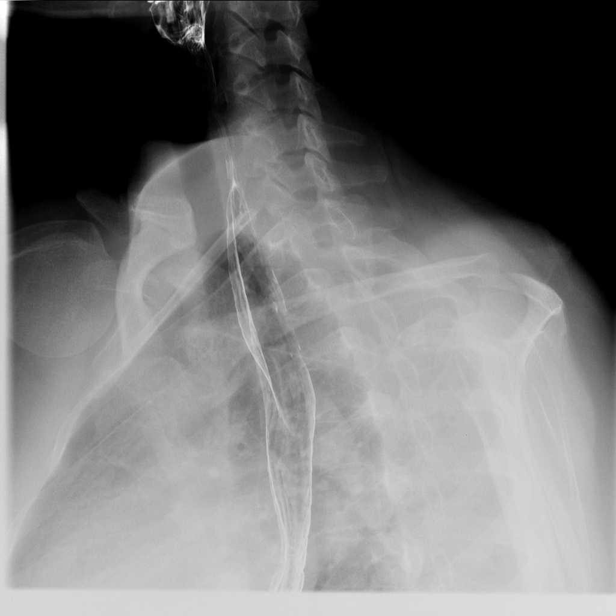

[Series 8: fluoro_barium 2fps_bw · 0.18mm/px · 2 of 2 frames shown (8 of 9)]
[frame 1/2]
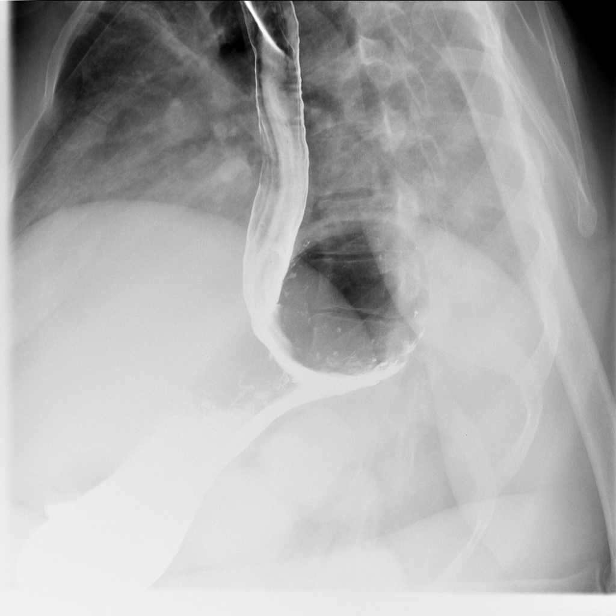
[frame 2/2]
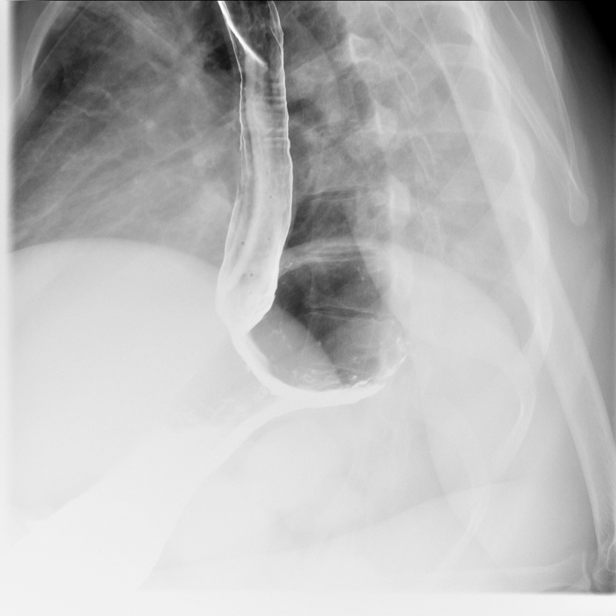

[Series 9: fluoro_barium 2fps_bw · 0.20mm/px · 3 of 3 slices shown (9 of 9)]
[im 1/3]
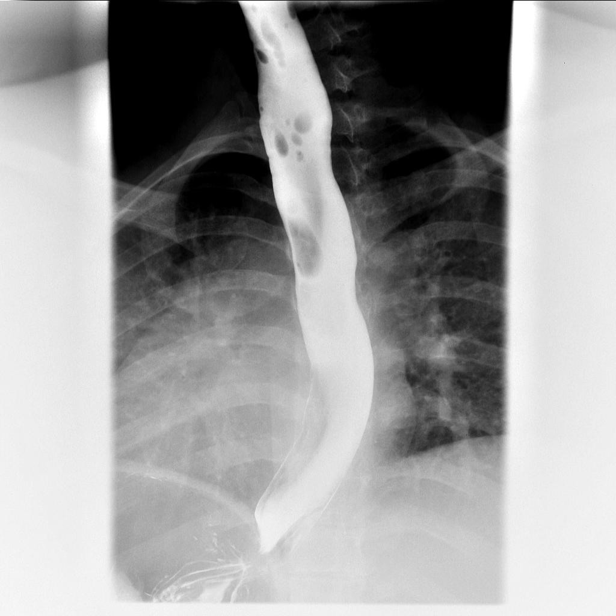
[im 2/3]
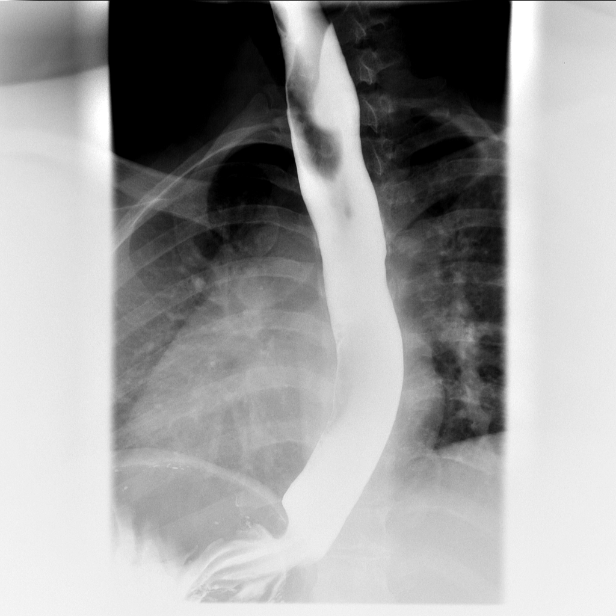
[im 3/3]
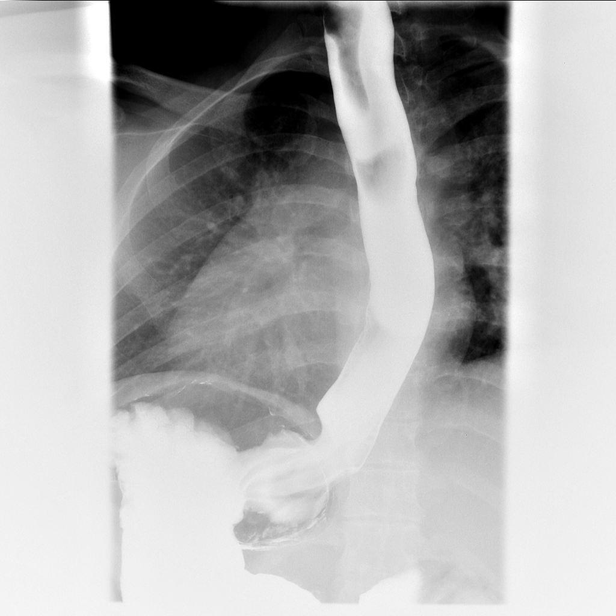

[14 of 14 positions shown; findings below may reference images not displayed]

PROCEDURE:     FL  - FL UPPER GI W/ BARIUM SWALLOW  - August 19, 2011  [DATE]

RESULT:     The anticipated procedure was discussed with Ms. Madgalena. She
voiced her willingness to proceed.

The cervical esophagus distended well. There was no laryngeal penetration of
the barium. The thoracic esophagus distended well. There is no evidence of
stricture or esophagitis. No hiatal hernia or reflux was elicited. In the
prone position esophageal motility was normal. The 12 mm barium pill passed
without difficulty.
IMPRESSION: Normal barium swallow examination.

## 2014-11-19 NOTE — Consult Note (Signed)
Brief Consult Note: Diagnosis: Bipolar affective disorder, PTSD chronic, Mild MR.   Patient was seen by consultant.   Consult note dictated.   Recommend further assessment or treatment.   Orders entered.   Discussed with Attending MD.   Comments: Ms. [name] has a h/o mental illness and mild MR. She was brought to the ER after a suicidal gesture by scratching her wrist. She does not meet criteria for admission. She is not allowed to return to her group home as she behaves dangerously there.   Efforts to place this unfortunate patient in a group home in [place] have failed. [facility] but a bed is not available there yet. We spoke extensively with her guardian, who does not want to get involved, her case worker and DSS. We do not have a disposition for Ms. Ringold yet. Her committment expires tomorow night. Her behavior in the ER has been exemplary.     MSE: Alert and oriented, pleasant and polite. No behavioral problems, no psychotic symptoms, no suicidal/homicidal thoughts.   PLAN: 1. We will continue IVC until safe placement.   2. She is to continue all medications as prescribed by her primary psychiatrist Dr. Haynes BastLynn Wessen.    3. She is to continue medications for HIV as prescribed by Dr. Leavy CellaBlocker for HIV infection.   4. Dispo-TBE. The guardian gave us carte blanche. She does not want to be bothred, DSS is aware.  Electronic Signatures: Kristine LineaPucilowska, Jolanta (MD)  (Signed 12-Apr-13 17:05)  Authored: Brief Consult Note   Last Updated: 12-Apr-13 17:05 by Kristine LineaPucilowska, Jolanta (MD)
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
## 2014-11-19 NOTE — Consult Note (Signed)
Brief Consult Note: Diagnosis: Bipolar affective disorder, PTSD chronic, Mild MR.   Patient was seen by consultant.   Consult note dictated.   Recommend further assessment or treatment.   Orders entered.   Discussed with Attending MD.   Comments: [name] has a h/o mental illness and mild MR. She was brought to the ER after a suicidal gesture by scratching her wrist. She does not meet criteria for admission. She is not allowed to return to her group home as she behaves dangerously there.   Efforts to place this unfortunate patient in a group home in [place] have failed. [facility] is [facility] but a bed is not available there yet. We spoke extensively with her guardian, who does not want to get involved, her case worker and DSS. We reported this case to [employer].  The bottom line is that we do not have a safe disposition for Sarah Jackson yet.   MSE: Alert and oriented, pleasant and polite. No behavioral problems, no psychotic symptoms, no suicidal/homicidal thoughts.   PLAN: 1. I renewed her IVC as there is no safe disposition for this patient with mental illness, cognitive probllems, and HIV infection who is also hypersexual. We continue negitiations with the guardian, DSS, and case worker.     2. She is to continue all medications as prescribed by her primary psychiatrist Dr. Haynes BastLynn Wessen.    3. She is to continue medications for HIV as prescribed by Dr. Leavy CellaBlocker for HIV infection.   4. Dispo-TBE.  Electronic Signatures: Kristine LineaPucilowska, Shaquisha Wynn (MD)  (Signed 17-Apr-13 20:40)  Authored: Brief Consult Note   Last Updated: 17-Apr-13 20:40 by Kristine LineaPucilowska, Clema Skousen (MD)

## 2014-11-19 NOTE — Consult Note (Signed)
Brief Consult Note: Diagnosis: Bipolar affective disorder, PTSD chronic, Mild MR.   Patient was seen by consultant.   Consult note dictated.   Recommend further assessment or treatment.   Orders entered.   Discussed with Attending MD.   Comments: [name] has a h/o mental illness and mild MR. She was brought to the ER after a suicidal gesture by scratching her wrist. She does not meet criteria for admission. She is not allowed to return to her group home as she behaves dangerously there.   Efforts to place this unfortunate patient in a group home in [place] have failed. She is [facility] but a bed is not available there yet. We spoke extensively with her guardian, who does not want to get involved, her case worker and DSS. We do not have a disposition for [name] yet.   The "cousin", Sarah Jackson who lives in [place] never showed up for our meeting.    MSE: Alert and oriented, pleasant and polite. No behavioral problems, no psychotic symptoms, no suicidal/homicidal thoughts.   PLAN: 1. I renewed her IVC as there is no safe disposition for this patient with mental illness, cognitive probllems, and HIV infection who is also hypersexual. We continue negitiations with the guardian, DSS, and case worker.     2. She is to continue all medications as prescribed by her primary psychiatrist Dr. Haynes BastLynn Jackson.    3. She is to continue medications for HIV as prescribed by Dr. Leavy Jackson for HIV infection.   4. Somatic complaints-she has no appetite and feels dehydrated. She requests an IV.  5. Dispo-TBE.  Electronic Signatures: Sarah Jackson, Sarah Jackson (MD)  (Signed 17-Apr-13 01:30)  Authored: Brief Consult Note   Last Updated: 17-Apr-13 01:30 by Sarah Jackson, Sarah Jackson (MD)

## 2014-11-19 NOTE — Consult Note (Signed)
PATIENT NAME:  Sarah Jackson, Sarah Jackson MR#:  [id number] DATE OF BIRTH:  26-Aug-1985  DATE OF CONSULTATION:  08/17/2011  REFERRING PHYSICIAN:   CONSULTING PHYSICIAN:  Scot Jun, MD  HISTORY OF PRESENT ILLNESS: The patient is a 29 year old female who was admitted to the Psychiatry Ward. She was seen by Internal Medicine consultation by Dr. Dava Najjar at the request of Dr. Maryruth Bun for nausea, vomiting, and abdominal pain, supposedly unable to tolerate p.o. food. The staff tells me that no one has witnessed any vomiting or diarrhea. During Dr. Corene Cornea examination, the patient was easily distracted and appeared to have no tenderness to palpation in her abdomen.   The patient has a history of mild mental retardation, bipolar disorder, PTSD, asthma, and is HIV positive. She has a history of multiple psychiatric admissions, history of depression, history of extended hospitalizations. She has been deemed incompetent and has a legal guardian, someone named Judithann Graves from the [place] area. What relationship she is to the patient I do not know.   ALLERGIES: No known allergies to medications. Complains of onions and tomatoes being an allergy.   PAST SURGICAL HISTORY: None.   CURRENT SOCIAL HISTORY: She lives in a group home. Has had problems living in group homes.   HABITS: Smokes eight cigarettes a day. No alcohol.   FAMILY HISTORY: Father with diabetes.   MEDICATIONS ON ADMISSION:  1. Combivent 2 puffs q.6 hours p.r.n.  2. Cogentin 0.5 mg b.i.d.  3. Colace 200 mg b.i.d.  4. Truvada 1 tablet a day for HIV positive. 5. Vistaril 50 mg every eight hours. 6. Lactulose 30 mg t.i.d. This was discontinued because of diarrhea. 7. Kaletra 2 tablets b.i.d. This is also for HIV. 8. Mobic 15 mg at bedtime.   9. Omeprazole 20 mg daily. 10. Seroquel 100 mg at bedtime.  11. Zoloft 100 mg a day.  12. Valtrex 500 mg daily.  13. Geodon 40 mg b.i.d.  14. Nicotine inhaler.   PAST MEDICAL HISTORY:   1. Bipolar disorder. 2. PTSD.  3. Mild mental retardation. 4. Gastroesophageal reflux disease. 5. Asthma. 6. HIV. 7. Genital warts.   This is a white female who looks about stated age. She complains of sharp pain across the mid abdomen all the way across and across her back. Pain is constant. She said she's not eaten any food for three days, smells food and vomits x3. None of the staff have witnessed any vomiting. The patient has no prior surgery. She does see Dr. Leavy Cella for HIV. The patient had a CAT scan on 01/18 that was negative. She has some renal insufficiency. Creatinine was 1.66 on this admission with bicarb running 18 and 19.   PHYSICAL EXAMINATION:   GENERAL: White female in no acute distress. Flat affect.   HEENT: Sclerae nonicteric. Conjunctivae negative. Tongue negative.   NECK: No thyromegaly.   CHEST: Clear.   HEART: No murmurs or gallops I can hear.   ABDOMEN: Bowel sounds present. No hepatosplenomegaly. No masses. No bruits. No significant tenderness. Minimal tenderness to palpation all across the upper abdomen.   EXTREMITIES: No edema.   SKIN: Warm and dry.   RECTAL: Not done.   LABORATORY, DIAGNOSTIC, AND RADIOLOGICAL DATA: Checking through old labs in October 2012 she had a creatinine of 1.6 and a bicarb of 24. In December she had a creatinine of 1.77 and a bicarb of 19. On this admission she had a creatinine of 1.5 and a bicarb of 17. She has had elevated alkaline  phosphatases in the past. These have come down some. Although the patient had normal glucose in her blood, she had very elevated glucose in her urine which makes one suspicious of addition of sugar into her urine specimen. The patient had an ultrasound October 2012 that was negative and she had a CAT scan this admission of the abdomen which was negative for any hepatic or pancreatic or any other abnormalities.   ASSESSMENT: The patient is with complaints of abdominal pain while on omeprazole and  nonsteroidal anti-inflammatory drug. She is also on medications that rarely can cause liver problems and fatty liver and can cause pancreatitis. There was no evidence of pancreatitis or fatty liver on the CAT scan. Her symptoms have to be viewed with some skepticism since the staff reports no evidence of vomiting or diarrhea that they have seen.    RECOMMENDATIONS:  1. Because of her elevated creatinine and low bicarbonate, she may have some chronic renal insufficiency. I recommend a Nephrology consult.  2. Because of her abdominal pain, I recommend either an upper endoscopy or an upper GI. Will talk to Dr. Maryruth BunKapur about the choice of either of these tests. I would probably bump her Prilosec to twice a day and check a blood test antibody for H. pylori.   Will follow with you.  ____________________________ Scot Junobert T. Elliott, MD rte:drc D: 08/17/2011 22:05:00 ET T: 08/18/2011 06:01:29 ET JOB#: [id number]  cc: Scot Junobert T. Elliott, MD, <Dictator>, Doralee AlbinoAarti K. Maryruth BunKapur, MD, Rosalyn GessMichael E. Blocker, MD Scot JunOBERT T ELLIOTT MD ELECTRONICALLY SIGNED 09/11/2011 17:08

## 2014-11-19 NOTE — Consult Note (Signed)
Brief Consult Note: Diagnosis: Bipolar affective disorder, PTSD chronic, Mild MR.   Patient was seen by consultant.   Consult note dictated.   Recommend further assessment or treatment.   Orders entered.   Discussed with Attending MD.   Comments: [name] has a h/o mental illness and mild MR. She was brought to the ER after a suicidal gesture by scratching her wrist. She does not meet criteria for admission. She is not allowed to return to her group home as she behaves dangerously there. Ther are effotrs under way to place her in a group home in [place]. There is a meeting going on right now. We will now more this afternoon.   MSE: Alert and oriented, pleasant and polite. No behavioral problems, no psychotic symptoms, no suicidal/homicidal thoughts.   PLAN: 1. We will continue IVC until safe placement is found.   2. She is to continue all medications as prescribed by her primary psychiatrist Dr. Haynes BastLynn Wessen.    3. She is to continue medications for HIV as prescribed by Dr. Leavy CellaBlocker for.   4. Dispo-TBE. The guardian is involved in this case.  Electronic Signatures: Kristine LineaPucilowska, Lashelle Koy (MD)  (Signed 09-Apr-13 13:28)  Authored: Brief Consult Note   Last Updated: 09-Apr-13 13:28 by Kristine LineaPucilowska, Briony Parveen (MD)
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
## 2014-11-19 NOTE — Consult Note (Signed)
Brief Consult Note: Diagnosis: Bipolar affective disorder, PTSD chronic, Mild MR.   Patient was seen by consultant.   Consult note dictated.   Recommend further assessment or treatment.   Orders entered.   Discussed with Attending MD.   Comments: [name] has a h/o mental illness and mild MR. She was brought to the ER after she run away from her group home. She is cool and collected now. She is not suicidal, homicidal, excessively anxious or psychotic.   PLAN: 1. The patient no longer meets criteria for IVC. I will terminate proceedings. Please discharge as appropriate.  2. She is to continue all medications as prescribed at [facility].  3. I will add clonazepam 0.5 mg as needed for anxiety. Rx written.  4. She is to follow up with Dr. Alver Jackson at [facility].  5. She is to follow up with Dr. Leavy Jackson today at 1:45 at his office for new HIV medications.  Electronic Signatures: Kristine LineaPucilowska, Lindsi Bayliss (MD)  (Signed 04-Feb-13 12:26)  Authored: Brief Consult Note   Last Updated: 04-Feb-13 12:26 by Kristine LineaPucilowska, Mennie Spiller (MD)

## 2014-11-19 NOTE — Consult Note (Signed)
Brief Consult Note: Diagnosis: Bipolar affective disorder, PTSD chronic, Mild MR.   Patient was seen by consultant.   Consult note dictated.   Recommend further assessment or treatment.   Orders entered.   Discussed with Attending MD.   Comments: [name] has a h/o mental illness and mild MR. She was brought to the ER after a suicidal gesture by scratching her wrist. She does not meet criteria for admission. She is not allowed to return to her group home as she behaves dangerously there.   Efforts to place this unfortunate patient in a group home in [place] or in [place] have failed. She is accepted to a group home in [place] but the burocratic process has not been completed yet but an alternative, temporary solution has been found in  a supervised appartment. She will be discharged today.    MSE: Alert and oriented, pleasant and polite. No behavioral problems, no psychotic symptoms, no suicidal/homicidal thoughts.   PLAN: 1. The patient does not meet criteria for IVC. Please discharge as appropriate.   2. She is to continue all medications as prescribed by her primary psychiatrist Dr. Haynes BastLynn Wessen.  Rx given.  3. She is to continue medications for HIV infection as prescribed by Dr. Leavy CellaBlocker. Rx given.  4. Dispo-Discharge today with Deon PillingGerald Snow from [place] [facility] program.  Electronic Signatures: Kristine LineaPucilowska, Ayonna Speranza (MD)  (Signed 29-Apr-13 09:04)  Authored: Brief Consult Note   Last Updated: 29-Apr-13 09:04 by Kristine LineaPucilowska, Carman Essick (MD)

## 2014-11-19 NOTE — Consult Note (Signed)
Brief Consult Note: Diagnosis: Bipolar affective disorder, PTSD chronic, Mild MR.   Patient was seen by consultant.   Consult note dictated.   Recommend further assessment or treatment.   Orders entered.   Discussed with Attending MD.   Comments: [name] has a h/o mental illness and mild MR. She was brought to the ER after a suicidal gesture by scratching her wrist. She does not meet criteria for admission. She is not allowed to return to her group home as she behaves dangerously there.   Efforts to place this unfortunate patient in a group home in [place] have failed. She is accepted [facility] a local [facility] but a bed is not available there yet. We spoke extensively with her guardian, who does not want to get involved, her case worker and DSS. We do not have a disposition for Sarah Jackson yet.   There is a "cousin" who lives in [place] who is willing to allow her to stay with him, at least temporarily. We have guardian's verbal permission to do "whatever".    MSE: Alert and oriented, pleasant and polite. No behavioral problems, no psychotic symptoms, no suicidal/homicidal thoughts.   PLAN: 1. I renewed her IVC as there is no safe disposition for this patient with mental illness, cognitive probllems, and HIV infection who is also hypersexual. On Monday we will try to discuss releasing her to the cousin's care with the guardian and DDS.    2. She is to continue all medications as prescribed by her primary psychiatrist Dr. Haynes BastLynn Wessen.    3. She is to continue medications for HIV as prescribed by Dr. Leavy CellaBlocker for HIV infection.   4. Somatic complaints-she has no appetite and feels dehydrated. She requests an IV.  5. Dispo-TBE. I will contact [employer] [place] on Monday.  Electronic Signatures: Kristine LineaPucilowska, Caroljean Monsivais (MD)  (Signed 14-Apr-13 18:19)  Authored: Brief Consult Note   Last Updated: 14-Apr-13 18:19 by Kristine LineaPucilowska, Krystal Delduca (MD)

## 2014-11-19 NOTE — Consult Note (Signed)
Brief Consult Note: Diagnosis: Bipolar affective disorder, PTSD chronic, Mild MR.   Patient was seen by consultant.   Consult note dictated.   Recommend further assessment or treatment.   Orders entered.   Discussed with Attending MD.   Comments: Ms. [name] has a h/o mental illness and mild MR. She was brought to the ER after a suicidal gesture by scratching her wrist. She does not meet criteria for admission. She is not allowed to return to her group home as she behaves dangerously there.   Efforts to placethis unfortunate patient in a group home in [place] have failed. She is accepted to a local [facility]. and will be transferred there ASAP.    MSE: Alert and oriented, pleasant and polite. No behavioral problems, no psychotic symptoms, no suicidal/homicidal thoughts.   PLAN: 1. We will continue IVC until safe placement.   2. She is to continue all medications as prescribed by her primary psychiatrist Dr. Haynes BastLynn Wessen.    3. She is to continue medications for HIV as prescribed by Dr. Leavy CellaBlocker for HIV infection.   4. Dispo-TBE. The guardian is only marginally involved in this case.  Electronic Signatures: Kristine LineaPucilowska, Jolanta (MD)  (Signed 11-Apr-13 18:06)  Authored: Brief Consult Note   Last Updated: 11-Apr-13 18:06 by Kristine LineaPucilowska, Jolanta (MD)
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
## 2014-11-19 NOTE — Consult Note (Signed)
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  Her renal insufficiency could be due to her tenofovir.  Agree with holding her HAART.  She has not been on any prior regimen and her most recent viral load shows almost complete suppression. Will plan on changing her to [place] and Ezpzicom.  Prior to starting any abacavir containing regimen, HLA B-5701 testing must be obtained to assess the risk for abacavir allergic reaction.  This reaction can be life threatening.  Will send HLA B-5701 testing today. Hold her HAART until I see her again on 09/01/11 at 1:40PM. Will restart her therapy at that time based on the HLA B-5701 testing.  Her CD4 count is high enough that this short interruption in therapy will not likely be significant. No need for OI prophylaxis.  Electronic Signatures: Henleigh Robello, Heinz Knuckles (MD)  (Signed on 22-Jan-13 15:54)  Authored  Last Updated: 22-Jan-13 15:54 by Crosby Bevan, Heinz Knuckles (MD)

## 2014-11-19 NOTE — Consult Note (Signed)
Since patient tests neg and  eating and drinking and pain is better per other doctors reports I will sign off, recommend take omeprazole for 2-3 months minimum.  Electronic Signatures: Scot JunElliott, Brayden Brodhead T (MD)  (Signed on 23-Jan-13 16:42)  Authored  Last Updated: 23-Jan-13 16:42 by Scot JunElliott, Lestine Rahe T (MD)

## 2014-11-19 NOTE — Consult Note (Signed)
Brief Consult Note: Diagnosis: Bipolar affective disorder, PTSD chronic, Mild MR.   Patient was seen by consultant.   Consult note dictated.   Recommend further assessment or treatment.   Orders entered.   Discussed with Attending MD.   Comments: [name] has a h/o mental illness and mild MR. She was brought to the ER after a suicidal gesture by scratching her wrist. She does not meet criteria for admission. She is not allowed to return to her group home as she behaves dangerously there.   Efforts to place this unfortunate patient in a group home in [place] have failed. She is accepted [facility] but a bed is not available there yet. We spoke extensively with her guardian, who does not want to get involved, her case worker and DSS. We do not have a disposition for Sarah Jackson yet.   There is a "cousin" who lives in [place] who is willing to allow her to stay with him, at least temporarily. We have guardian's verbal permission to do "whatever".    MSE: Alert and oriented, pleasant and polite. No behavioral problems, no psychotic symptoms, no suicidal/homicidal thoughts.   PLAN: 1. I will renew her IVC as there is no safe disposition for this patient with mental illness, cognitive probllems, and HIV infection who is also hypersexual. On Monday we will try to discuss releasing her to the cousin's care with the guardian and ADS.    2. She is to continue all medications as prescribed by her primary psychiatrist Dr. Haynes BastLynn Wessen.    3. She is to continue medications for HIV as prescribed by Dr. Leavy CellaBlocker for HIV infection.   4. Dispo-TBE. I will contact [employer] [place] on Monday.  Electronic Signatures: Kristine LineaPucilowska, Jolanta (MD)  (Signed 13-Apr-13 09:31)  Authored: Brief Consult Note   Last Updated: 13-Apr-13 09:31 by Kristine LineaPucilowska, Jolanta (MD)

## 2014-11-19 NOTE — Consult Note (Signed)
PATIENT NAME:  Sarah Jackson, Sarah Jackson MR#:  [id number] DATE OF BIRTH:  12-20-1985  DATE OF CONSULTATION:  08/18/2011  REFERRING PHYSICIAN:  Gaylyn Cheers, MD  CONSULTING PHYSICIAN:  Antha Niday Lilian Kapur, MD  REASON FOR CONSULTATION: Chronic kidney disease stage III.   HISTORY OF PRESENT ILLNESS: The patient is a 29 year old Caucasian female with past medical history of HIV infection on HAART, asthma, hyperlipidemia, gastroesophageal reflux disease, and genital warts who was admitted to [place] at [facility] on 08/14/2011. She was admitted for suicidal ideation. She is under the care of Dr. Cephus Shelling at present. We are now consulted for the evaluation and management of renal insufficiency. In review of the patient's labs, it appears that she has had an elevated creatinine since admission here. The patient's baseline creatinine appears to be between 1.3 and 1.7. Creatinine now is 1.54. She also has evidence for metabolic acidosis with most recent serum bicarbonate of 18. ABG was drawn during my evaluation. The patient also had a urinalysis performed on 08/15/2011, which showed significant glucosuria.  However, the patient does not appear to have any history of diabetes mellitus. There is also some mild proteinuria with a urine protein of 30 mg/dL. She is on highly active antiretroviral therapy with Truvada as well as Kaletra. It is noted that Truvada contains tenofovir, which is known to cause renal insufficiency as well as Fanconi type syndrome which we are currently suspecting given glucosuria, renal insufficiency, and metabolic acidosis. The patient is regularly followed by Dr. Lanae Boast for management of her HIV. I am unable to speak directly with Dr. Clayborn Bigness at present given the Federal holiday today.   PAST MEDICAL HISTORY:  1. HIV.  2. Asthma.  3. Genital warts.  4. Gastroesophageal reflux disease.  5. Mild mental retardation.  6. Posttraumatic stress disorder.   7. History of bipolar disorder.   PAST SURGICAL HISTORY: None.   SOCIAL HISTORY: The patient reports that she was living in a group home prior to admission here at [place]. She states that she smokes eight cigarettes per day. Denies alcohol or illicit drug use. She is unmarried and states that she does not have any children.   FAMILY HISTORY: Mother is alive and apparently healthy. Father had a history of diabetes mellitus.   REVIEW OF SYSTEMS: CONSTITUTIONAL: Denies fevers, chills, or weight loss. EYES: Denies diplopia or blurry vision. HENT: Denies headaches, hearing loss, tinnitus, epistaxis, or sore throat. PULMONARY: Denies cough, shortness of breath, or hemoptysis at present. She does have history of asthma. CARDIOVASCULAR: Denies chest pain, palpitations, PND, or orthopnea. GI: Has been troubled with nausea, vomiting, and abdominal pain recently. She also reports lower back pain. GU: Denies frequency, urgency, or dysuria. NEUROLOGIC: Denies focal extremity numbness, weakness, or tingling. MUSCULOSKELETAL: Denies joint pain, swelling, or redness. INTEGUMENT: Denies skin rashes or lesions. ENDOCRINE: Denies polyuria, polydipsia, or polyphagia. HEMATOLOGIC/LYMPHATIC: Denies easy bruisability, bleeding, or swollen lymph nodes.  PSYCHIATRIC: The patient is with known bipolar disorder  and now admitted to [place] for suicidal ideation. ALLERGY/IMMUNOLOGIC: Denies seasonal allergies. Does have history of HIV.   PHYSICAL EXAMINATION:  VITAL SIGNS: Temperature 96.4, pulse 79, respirations 20, blood pressure 119/85.   GENERAL: Well-developed, well-nourished Caucasian female who appears her stated age, currently in no acute distress.   HEENT: Normocephalic, atraumatic. Extraocular movements are intact. Pupils equal, round, and reactive to light. No scleral icterus. Conjunctivae are pink. No epistaxis noted. Gross hearing intact. Oral mucosa moist.   NECK: Supple  without JVD or  bruits.   LUNGS: Clear to auscultation bilaterally with normal respiratory effort.   HEART: S1, S2, regular rate and rhythm. No murmurs, rubs, or gallops appreciated.   ABDOMEN: Soft, nontender, nondistended. Bowel sounds positive. No rebound or guarding. No gross organomegaly appreciated.   EXTREMITIES: No clubbing, cyanosis, or edema.   NEUROLOGIC: The patient is alert and oriented to time, person, and place. Strength is five out of five in both upper and lower extremities.   SKIN: Warm and dry. No rashes noted.   MUSCULOSKELETAL: No joint redness, swelling, or tenderness appreciated.   PSYCHIATRIC: The patient is with rather flat affect at present, but appears to have some insight into her current illness.   LABORATORY DATA: Sodium 146, potassium 4.5, chloride 118, CO2 18, BUN 9, creatinine 1.54, glucose 83, eGFR of 43. LFTs show total protein 6.8, albumin 2.8, total bilirubin 0.5, alkaline phosphatase 188, AST 48, ALT 30. Urinalysis shows significant glucosuria with a urine glucose greater than 500 mg/dL, urine protein 30 mg/dL, one RBC per high-power field, and three WBCs per high-power field. Vitamin B12 is high at 1214. CT scan of the abdomen and pelvis was negative for hydronephrosis. Lactic acid was less than 0.43.   IMPRESSION: This is a 29 year old Caucasian female with past medical history of PTSD, bipolar disorder, mild mental retardation, gastroesophageal reflux disease, asthma, genital warts, and HIV who presented to Behavioral Medicine for evaluation and treatment of suicidal ideation. 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    PROBLEM LIST:  1. Chronic kidney disease, stage III.  2. Metabolic acidosis with low serum bicarbonate of 18.  3. HIV.  4. 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   PLAN: The patient presents with a very interesting  case. At present she appears to have renal insufficiency, mild proteinuria, glucosuria, and metabolic acidosis. These findings are highly suggestive of Fanconi syndrome, particularly given the fact that the patient does not have diabetes mellitus. The patient is noted to be on Truvada, which contains tenofovir, which is associated with Fanconi syndrome. 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 We will need to discuss the case further with Dr. Clayborn Bigness. Alternative highly active antiretroviral therapy may need to be found for the patient. For now we will obtain a renal ultrasound. We will also check SPEP, UPEP, and ANA. We will also check further stool electrolytes. I would like to thank Dr. Vira Agar for this kind and interesting referral. Further plan as the patient progresses.    ____________________________ Tama High, MD [id number] D: 08/18/2011 17:35:41 ET T: 08/18/2011 18:15:40 ET JOB#: [id number]  cc: Tama High, MD, <Dictator> Tama High MD ELECTRONICALLY SIGNED 09/05/2011 20:44
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
## 2014-11-19 NOTE — Consult Note (Signed)
Brief Consult Note: Diagnosis: Bipolar affective disorder, PTSD chronic, Mild MR.   Patient was seen by consultant.   Consult note dictated.   Recommend further assessment or treatment.   Orders entered.   Discussed with Attending MD.   Comments: [name] has a h/o mental illness and mild MR. She was brought to the ER after a suicidal gesture by scratching her wrist. She does not meet criteria for admission. She is not allowed to return to her group home as she behaves dangerously there.   Efforts to place this unfortunate patient in a group home in [place] or in [place] have failed. She is accepted to a group home in [place] but the burocratic process will not be completed until Monday or even Tuesday. We spoke extensively with her guardian, who is marginally involved but approves of our effords.   Of note, the patient is no longer on IVC. She does not meet criteria but we do not have a safe disposition for Sarah Jackson yet.   MSE: Alert and oriented, pleasant and polite. No behavioral problems, no psychotic symptoms, no suicidal/homicidal thoughts.   PLAN: 1. There is no safe disposition for this patient with mental illness, cognitive probllems, and HIV infection who is also hypersexual. We continue negitiations with the guardian, DSS, and case worker.     2. She is to continue all medications as prescribed by her primary psychiatrist Dr. Haynes BastLynn Wessen.    3. She is to continue medications for HIV as prescribed by Dr. Leavy CellaBlocker for HIV infection.   4. Dispo-TBE.  Electronic Signatures: Kristine LineaPucilowska, Claryce Friel (MD)  (Signed 25-Apr-13 21:32)  Authored: Brief Consult Note   Last Updated: 25-Apr-13 21:32 by Kristine LineaPucilowska, Katilin Raynes (MD)

## 2014-11-19 NOTE — Consult Note (Signed)
PATIENT NAME:  Sarah Jackson, Sarah Jackson MR#:  [id number] DATE OF BIRTH:  Dec 25, 1985  DATE OF CONSULTATION:  10/31/2011  CONSULTING PHYSICIAN:  Audery AmelJohn T. Brycen Bean, MD  IDENTIFYING INFORMATION AND REASON FOR CONSULT: The patient is Jackson 29 year old woman with multiple medical problems and history of mood lability and behavior problems who came into the Emergency Room stating that she needed Jackson place to live. The patient's consult was because of some symptoms of depression.   CHIEF COMPLAINT: "[facility]."   HISTORY OF PRESENT ILLNESS: The patient states that she came into the Emergency Room today because she does not have Jackson place to stay. [facility] of the [facility] yesterday. According to her, that is because her ex-boyfriend was already staying there. She and he are known to be combative together and difficult. Apparently in the past they have had him leave, but according to her this time they wanted her to leave. She is evidently not allowed to go back there right now. She just recently left [facility] group home. She says now that she wants to [facility] back [facility]. She is saying that her mood feels Jackson little bit depressed. She relates that to the stress that she is under, specifically not having Jackson place to stay and her medical problems. She denies any suicidal ideation or wish to die. She denies any homicidal ideation or violence. She denies any auditory or visual hallucinations. She says that she chronically sleeps fairly poorly and particularly slept badly last night, in the Emergency Room. Medically, she is complaining of pain in her stomach, chronic nausea and anorexia. She has Jackson history of excessive somatic complaints but also has Jackson history of gastroesophageal reflux symptoms and uses aspirin daily. She is not reporting any other psychiatric symptoms.   PAST PSYCHIATRIC HISTORY: The patient has Jackson history of mood lability and behavior problems that do not fit into Jackson  clear specific diagnostic pattern which is typical of people with developmental disabilities. 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 She is declared incompetent and has Jackson guardian. She has cut herself in the past. She has not done that in the immediate past. She has not recently been particularly aggressive or violent. She is maintained on psychiatric medicines including Zoloft, Depakote, Geodon, Cogentin, Vistaril and trazodone regularly. She claims that she has been taking her medicine when she stayed at the group home. It is not clear that she had them with her when she went to the shelter. She has had psychiatric hospitalizations in the past when she claims suicidal ideation or had cut herself. Most of these she did not have specific symptoms that really needed treatment in the hospital so much as required calming down and getting placed again. She has Jackson history of running away from group homes. She also has Jackson history of excessive or inappropriate sexual behavior that has gotten her in trouble.   PAST MEDICAL HISTORY: The patient is HIV positive. She has gastroesophageal reflux. She needs to be in Jackson controlled setting to stay on her medication responsibly.   SOCIAL HISTORY: She is incompetent and has Jackson guardian. Despite this, she tends to run away from group homes frequently. She will change her mind quickly, saying that one group home is bad and refusing to go there and then turning around and  wanting to go there. She has been very difficult to maintain placement consistently. Her family are not actively  involved in helping her out. Right now she is saying that she wants to go back to the group home run by [facility] because she says that they treated her well there. Apparently there has been some change in her status of her funding regarding Mental Health or Developmental Disability Services. I do not quite understand all of it, but that may have Jackson bearing on her placement.   REVIEW OF  SYSTEMS: She complains of feeling Jackson little bit depressed, not feeling suicidal or homicidal. She is not having hallucinations or psychotic symptoms. She complains of abdominal pain, and feeling sick to her stomach, and not being able to eat or drink recently. She says she gets migraine headaches almost daily and usually takes aspirin to deal with it. No other acute physical complaints.   MENTAL STATUS EXAM: The patient was interviewed in the Emergency Room. She was awake and alert and cooperative. She understood her surroundings, and why she was here, and why I was talking to her. Her speech is normal in rate and tone. Affect is flat. Eye contact is good. Mood is stated as being Jackson little bit depressed. Thoughts are generally lucid, slow and simple, typical of someone with borderline intellectual functioning. No bizarre thinking. No evidence of delusions. She denies hallucinations, denies suicidal or homicidal ideation. Her judgment and insight are chronically impaired by her developmental disability but not worse than usual, I would say, especially since she has the honesty to tell us that she came into the hospital just to find placement.   PHYSICAL EXAMINATION: Jackson full physical exam was not done.  GENERAL: She does not look like she is in terrible distress, although she has not been eating since she has been here and has not been drinking fluids.   HEENT: Pupils are equal and reactive. Face is symmetric.   MUSCULOSKELETAL: Full range of motion appears to be present at all extremities.  VITAL SIGNS: Blood pressure 92/48, pulse 74, temperature 98.4.  LABORATORY, DIAGNOSTIC AND RADIOLOGICAL DATA: Her chemistries show several abnormalities, and her valproic acid level is very low at 8 indicating noncompliance. Her platelet count is Jackson little bit low at 110, hematocrit and hemoglobin, though, are normal. White blood cell count is normal at 5.0. Pregnancy test is negative. I do not see Jackson drug screen.    ASSESSMENT: Jackson 29 year old woman with mental retardation and Jackson history of mood disorder, not otherwise specified, but currently presents to the Emergency Room very specifically because she has no place to live and wants placement. She is not reporting suicidal or homicidal ideation. She is not psychotic. She is not engaging in any acutely dangerous behavior, cooperative with treatment. At this point, it is appropriate to start her back on her psychiatric medicine, but she does not need any further change in medicine and does not need admission to Psychiatry.   MEDICATIONS: Her psychiatric medicines are: 1. Zoloft 50 mg per day.  2. Vistaril 50 mg q.i.d.  3. Trazodone 100 mg at night.  4. Geodon 40 mg b.i.d.  5. Depakote 500 mg in the morning and 1000 mg at night.  6. Cogentin 0.5 mg b.i.d.   7. She is on some other medical medications as well.   ALLERGIES: No known medica allergies, only some food allergies.   TREATMENT PLAN: As I mentioned, I started her back on her psychiatric medicines as ordered previously. No indication for psychiatric admission. No indication for new psychiatric treatment. The patient needs placement. Other medical work-up  can be judged by the Emergency Room team.  I have spoken with the social worker who works in [employer], and she may be able possibly to have some assistance in trying to find this patient placement in [facility] group home, which is really what she needs.   DIAGNOSIS, PRINCIPAL AND PRIMARY:  AXIS I: Adjustment disorder with depressed mood.   SECONDARY DIAGNOSES:  AXIS I:  Mood disorder, not otherwise specified.   AXIS II:  Developmental disability, not otherwise specified, rule out mild mental retardation.    AXIS III:  1. Human immunodeficiency virus positive. 2. Gastroesophageal reflux disease.   AXIS IV: Severe from homelessness.   AXIS V: Functioning at time of evaluation 40.   ____________________________ Audery Amel,  MD jtc:cbb D: 10/31/2011 13:37:32 ET T: 10/31/2011 15:45:55 ET JOB#: [id number]  cc: Audery Amel, MD, <Dictator> Audery Amel MD ELECTRONICALLY SIGNED 10/31/2011 17:14

## 2014-11-19 NOTE — Consult Note (Signed)
PATIENT NAME:  Sarah Jackson, Sarah Jackson MR#:  [id number] DATE OF BIRTH:  12/08/1985  DATE OF CONSULTATION:  08/19/2011  REFERRING PHYSICIAN:   Dr. Nicolasa Ducking CONSULTING PHYSICIAN:  Heinz Knuckles. Gershon Shorten, MD  REASON FOR CONSULTATION: Renal failure possibly related HIV medicines.   HISTORY OF PRESENT ILLNESS: The patient is a 29 year old white female with a past history significant for HIV infection with a CD4 count over 1000 on HAART, depression, and bipolar disorder who was admitted on 08/14/2011 with suicidal thoughts. It was noted that her creatinine was elevated at 1.5. This prompted a renal consult, which raised the possibility of Fanconi syndrome related to her tenofovir. I have seen her once in the past in the office setting and, as she had had recent blood work in the hospital, had only obtained a viral load which was just over detectable. Review of her prior creatinines showed a low of 1.16 on 10/09 a high of 1.77 on 12/30. She has had nausea and vomiting since coming to the hospital. This has improved and she has started taking some p.o. She states that she has not been drinking much at home. She has had no fevers or chills. She has had no cough or shortness of breath. She denies any rashes or muscle or joint complaints.   ALLERGIES: No known drug allergies.   PAST MEDICAL HISTORY:  1. HIV infection. Her last CD4 count was over 1000 and her viral load was almost undetectable. She has been on Kaletra and Truvada for many years and has not been on any other regimens. She has had no history of opportunistic infections and no history of resistance.  2. Posttraumatic stress disorder.  3. Bipolar disorder.  4. Reflux.  5. Asthma.   SOCIAL HISTORY: She lives in a group home. She smokes less than a pack of cigarettes per day. She does not drink alcohol. No history of injecting drug use. However, her tox screen was positive for opioids and MDMA.   FAMILY HISTORY: Significant for diabetes.    REVIEW OF  SYSTEMS:  GENERAL: No fevers, chills, or sweats. HEENT: No headaches. No sinus congestion. NECK: No stiffness. No swollen glands. RESPIRATORY: No cough or shortness of breath. No sputum production. CARDIAC: No chest pains or palpitations.  GI: Positive nausea and vomiting, which has been improving. She has had anorexia, which is somewhat better. She has had no change in her bowels.  GU: No change in her urine. MUSCULOSKELETAL: No muscle or joint complaints. SKIN: No rashes. NEUROLOGIC: No focal weakness. PSYCHIATRIC: She has had suicidal thoughts and depression but this has improved during her hospitalization. All other systems are negative.   PHYSICAL EXAMINATION:  VITAL SIGNS: T-max 98.7, pulse 88, blood pressure 119/85.   GENERAL: 29 year old obese white female in no acute distress.   HEENT: Normocephalic, atraumatic. Pupils equal and reactive to light. Extraocular motion appeared to be intact. Nose and lips appear to be within normal limits.   NECK: Supple. Full range of motion. Midline trachea. No lymphadenopathy.   LUNGS: Clear to auscultation bilaterally with good air movement. No focal consolidation.   CARDIAC: Regular rate and rhythm without murmur, rub, or gallop.   ABDOMEN: Soft, nontender, and nondistended. No hepatosplenomegaly. No hernia is noted.   EXTREMITIES: No evidence for tenosynovitis.   SKIN: No rashes. No stigmata of endocarditis, specifically no Janeway lesions or Osler nodes.  NEUROLOGIC: The patient was awake and interactive, moving all four extremities.   PSYCHIATRIC: Mood and affect appeared fairly normal. She was  interactive and asked appropriate questions about her HIV infection.   LABS/STUDIES: BUN 8, creatinine 1.57, bicarbonate 20, anion gap 9. On admission her bicarbonate was 17 with an anion gap of 15. LFTs showed AST 48, ALT 30, alkaline phosphatase 133, total bilirubin 0.5. TSH was 1.62. Tox screen was positive for opiates and MDMA. White count on  admission was 6 with a hemoglobin of 13.3, platelet count 126. No differential was obtained. Stool showed no white cells or red cells. Urinalysis had greater than 500 glucose, 1+ blood, 1+ leukocyte esterase, negative nitrites, 7 red cells, and 26 white cells. There were 45 epithelial cells present. Urine pregnancy test was negative. A CT scan of the abdomen and pelvis without contrast showed minimal bladder prominence but otherwise unremarkable. An upper GI with barium swallow was normal. Renal ultrasound was unremarkable.   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   RECOMMENDATIONS:  1. Her renal insufficiency could be due to her tenofovir.  Agree with holding her HAART. She has not been on any prior regimen and her most recent viral load shows almost complete suppression.  2. We will plan on changing her to Kaletra and Epzicom.  Prior to starting any abacavir-containing regimen, [id number] testing must be obtained to assess the risk for abacavir allergic reaction. This reaction can be life-threatening. We will send an HLA-[id number] today.  3. Will hold her HAART until I see her again on 09/01/2011 at 1:40 p.m.  4. We will restart her therapy at that time based on the HLA-[id number] testing. Her CD4 count is high enough that this short interruption in therapy will not likely be significant.  5. There is no need for OI prophylaxis.  6. She does not carry a diagnosis of diabetes. However, there was a significant glycosuria.  Her serum sugars, however, have all been normal. I would not be particularly worried about this as this may be related to her current renal issue and we can recheck the urinalysis as an outpatient.   This is a moderately complex infectious disease case. Thank you very much for involving me in Miss Blumenstock's care.    ____________________________ Heinz Knuckles. Janelie Goltz, MD meb:bjt D: 08/19/2011 16:06:36 ET T: 08/19/2011 16:54:15 ET JOB#: [id number]  cc: Heinz Knuckles. Wonda Goodgame, MD, <Dictator> Autumnrose Yore E Dashanae Longfield MD ELECTRONICALLY SIGNED 08/20/2011 15:31

## 2014-11-19 NOTE — Consult Note (Signed)
Patient had BS but not full UGI.  reports better on meds.  Continue PPI twice a day.  No plans for EGD at this time.   Electronic Signatures: Scot JunElliott, Trew Sunde T (MD)  (Signed on 22-Jan-13 18:00)  Authored  Last Updated: 22-Jan-13 18:00 by Scot JunElliott, Blaklee Shores T (MD)

## 2014-11-19 NOTE — Consult Note (Signed)
PATIENT NAME:  Sarah Jackson, Sarah Jackson MR#:  [id number] DATE OF BIRTH:  [birth year]  DATE OF CONSULTATION:  09/01/2011  REFERRING PHYSICIAN:  Gavin Poundobert Wiegand, MD CONSULTING PHYSICIAN:  Davette Nugent B. Khan Chura, MD  REASON FOR CONSULTATION: To assess Jackson suicidal patient on involuntary commitment.   IDENTIFYING DATA: [name] is Jackson 29 year old female with Jackson history of mood instability and mild mental retardation.   CHIEF COMPLAINT: "I am fine now."   HISTORY OF PRESENT ILLNESS: [name] was discharged from [facility] on the 25th of January to Jackson new group home. As it turns out, it was Jackson mistake. By placing the patient in regular group home that she likes we put in jeopardy her  CAP funding that allows her to stay in an [facility] higher level facility.  Her guardian decided to move her back to [facility] that the patient was living in before. The patient was quite upset and unhappy about the move and told the staff members that she was suicidal. Upon arrival in the Emergency Room she said that she really was not suicidal but said so trying to change her guardian's decision. She reports that she has been rather stable on her medications  prescribed by Dr. Maryruth BunKapur last month. She did follow up with Dr. Alver FisherMickiewicz at [facility]  the last of January. She has an appointment scheduled with Dr. 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 The patient feels sad about leaving [facility].  Not only did she like the place and like [facility] who is very Teacher, English as Jackson foreign languagepersonable and nice to her residents, but also had Jackson new boyfriend at the facility and is sad that she will not be able to see him at the new group home. She denies any symptoms of depression, anxiety, or psychosis. There is no alcohol, illicit drug, or prescription pill abuse.   PAST PSYCHIATRIC HISTORY: She has had multiple psychiatric  [facility] and [facility]. She has been treated on multiple medications usually with Jackson combination of an antipsychotic and Jackson mood stabilizer. She is an incompetent adult and has Jackson legal guardian, Judithann GravesMichelle Montgomery.  The patient is in the process of finding Jackson new psychiatrist in the area and the new facility will facilitate that. She has Jackson history of suicide attempt or gesture by wrist cutting.   FAMILY PSYCHIATRIC HISTORY: None reported.   PAST MEDICAL HISTORY:  1. Gastroesophageal reflux disease.  2. HIV-positive. 3. Arthritis. 4. Constipation.   ALLERGIES: No known drug allergies.   MEDICATIONS ON ADMISSION:  1. Geodon 40 mg twice daily.  2. Meloxicam 15 mg at bedtime. 3. Vistaril 50 mg every eight hours as needed for anxiety. 4. Zoloft 100 mg daily.  5. Seroquel 100 mg at bedtime.  6. Albuterol inhaler as needed.  7. Valtrex 500 mg daily.  8. Phenergan 25 mg as needed.  9. Loperamide 2 mg as needed.  10. Prilosec 20 mg daily.  11. Cogentin 0.5 mg twice daily.  12. Sodium bicarbonate 325 mg every eight hours.   SOCIAL HISTORY: As above. She is an incompetent adult. She is Jackson resident of [facility] group home. She has CAP funding allowing her to reside in an MR[facility] specialized group home. She is away from her family and misses them dearly. Oftentimes her visits with her family are dictated by her behavior. She  has not been seeing them Jackson lot lately.   REVIEW OF SYSTEMS: CONSTITUTIONAL: No fevers or chills. No weight changes. EYES: No double or blurred vision. ENT: No hearing loss. RESPIRATORY: No shortness of breath or cough. CARDIOVASCULAR: No chest pain or orthopnea. GASTROINTESTINAL: Positive for constipation. GU: No incontinence or frequency. ENDOCRINE: No heat or cold intolerance. LYMPHATIC: No anemia or easy bruising. INTEGUMENT: No acne or rash.  MUSCULOSKELETAL: Positive for joint pain. NEUROLOGIC: No tingling or weakness.   PSYCHIATRIC: See history of present illness for details.   PHYSICAL EXAMINATION:  VITAL SIGNS: Blood pressure 91/53, pulse 62, respirations 16, temperature 96.4.   GENERAL: This is Jackson well-developed female in no acute distress. Normal muscle strength in all extremities. No stiffness or cogwheeling. No tremor. The rest of the physical examination is deferred to her primary attending.   LABORATORY, DIAGNOSTIC, AND RADIOLOGICAL DATA: Chemistries are within normal limits except for blood glucose of 123. Blood alcohol level is zero. LFTs within normal limits except for alkaline phosphatase of 194. TSH 1.16. Urine tox screen negative for substances. CBC within normal limits except for MCV of 115. Urinalysis is not suggestive of urinary tract infection. Serum acetaminophen and salicylates are low. Urine pregnancy test is negative.   MENTAL STATUS EXAMINATION: The patient is alert and oriented to person, place, time, and situation. She is pleasant, polite, and cooperative. She recognizes me from previous admission. She is cool and collected. She is wearing hospital scrubs and Jackson yellow shirt. She maintains good eye contact. Her speech is soft. Mood is fine with full affect. Thought processing is logical and goal oriented. Thought content: She denies suicidal or homicidal ideation but was brought to the hospital after threatening suicide. There are no delusions or paranoia. There are no auditory or visual hallucinations. Her cognition is grossly intact. Her insight and judgment are questionable.   SUICIDE RISK ASSESSMENT: This is Jackson patient with Jackson lifelong history of mood instability, depression, poor coping skills, and suicide attempts who came to the hospital threatening suicide, but is no longer suicidal or homicidal and agrees to Jackson discharge plan according to her guardian.   DIAGNOSES:  AXIS I: Bipolar disorder, most recent episode depressed in partial remission. Chronic posttraumatic stress disorder.    AXIS II: Mild mental retardation.   AXIS III:   HIV-positive, genital warts, asthma, dyslipidemia, gastroesophageal reflux disease.   AXIS IV: Mental illness, coping skills, primary support, chronic medical problems, recent relocation.   AXIS V: GAF 45.     PLAN:  1. The patient no longer meets criteria for involuntary inpatient psychiatric commitment. I will terminate proceedings. Please discharge as appropriate.  2. She is to continue all her medications as at discharge from [place] at the end of January.  3. I will offer clonazepam 0.5 mg every  six hours as needed for anxiety at the request of her group home.  4. She will follow up with [facility] until she establishes care with Jackson new psychiatrist.  5. She will follow up with Dr. Leavy Cella. Her appointment was today.  It was changed to tomorrow.  6. Her guardian approved of the plan.  She is being [facility].   ____________________________ Ellin Goodie. Jennet Maduro, MD jbp:bjt D: 09/01/2011 20:17:22 ET T: 09/02/2011 09:22:10 ET JOB#: [id number]  cc: Elia Keenum B. Jennet Maduro, MD, <Dictator> Shari Prows MD ELECTRONICALLY SIGNED 09/12/2011 15:25

## 2014-11-19 NOTE — Consult Note (Signed)
Brief Consult Note: Diagnosis: Bipolar affective disorder, PTSD chronic, Mild MR.   Patient was seen by consultant.   Consult note dictated.   Recommend further assessment or treatment.   Orders entered.   Discussed with Attending MD.   Comments: [name] has a h/o mental illness and mild MR. She was brought to the ER after she run away from her group home to meet her boyfriend. She initially refused to return to her group home and threatened a suicide. She agrees to return there now. This is wahat hwer guardian wants her to do. She is cool and collected now. She is not suicidal, homicidal, excessively anxious or psychotic.   PLAN: 1. The patient no longer meets criteria for IVC. I will terminate proceedings. Please discharge as appropriate. She will have 1:1 at the group home.  2. She is to continue all medications as prescribed at [facility]. No prescriptions necessary.  3. She is to follow up with Dr. Haynes BastLynn Jackson on 09/17/2011 at 11:00.   4. She will follow up with Dr. Leavy Jackson for HIV treatment.   5. Sarah DanceKeith [facility] will pick her up. The guardian concurs with the plan.  Electronic Signatures: Sarah Jackson, Sarah Jackson (MD)  (Signed [id number]-Feb-13 16:24)  Authored: Brief Consult Note   Last Updated: 18-Feb-13 16:24 by Sarah Jackson, Sarah Jackson (MD)

## 2014-11-19 NOTE — Consult Note (Signed)
Brief Consult Note: Diagnosis: adjustment disorder.   Patient was seen by consultant.   Consult note dictated.   Comments: Psychiatry: Patient seen. Mild depression related to stress of being homeless. Denies SI, denies HI denies hallucinations. Not psychotic. No indication for psychiatric treatment. Pt wants to be put back in a group home. She basicly came to the ER because she has no place to stay. Meanwhile co 2 weeks of abdominal pain, nausea and anorexiaa. Uses aspirin almost daily for headaches. Suggest trying to find her safe placement.  Electronic Signatures: Coleta Grosshans, Jackquline DenmarkJohn T (MD)  (Signed Blase Mess05-Apr-13 13:22)  Authored: Brief Consult Note   Last Updated: 05-Apr-13 13:22 by Audery Amellapacs, Christinna Sprung T (MD)
